# Patient Record
Sex: Female | Born: 1990 | Race: Black or African American | Hispanic: No | Marital: Married | Smoking: Never smoker
Health system: Southern US, Community
[De-identification: ages and names within clinical notes are randomized; demographics above are authoritative.]

## PROBLEM LIST (undated history)

## (undated) ENCOUNTER — Inpatient Hospital Stay (HOSPITAL_COMMUNITY): Payer: Self-pay

## (undated) DIAGNOSIS — N946 Dysmenorrhea, unspecified: Secondary | ICD-10-CM

## (undated) DIAGNOSIS — Z789 Other specified health status: Secondary | ICD-10-CM

## (undated) DIAGNOSIS — O139 Gestational [pregnancy-induced] hypertension without significant proteinuria, unspecified trimester: Secondary | ICD-10-CM

## (undated) HISTORY — DX: Dysmenorrhea, unspecified: N94.6

---

## 2009-06-22 ENCOUNTER — Emergency Department (HOSPITAL_COMMUNITY): Admission: EM | Admit: 2009-06-22 | Discharge: 2009-06-22 | Payer: Self-pay | Admitting: Emergency Medicine

## 2009-07-01 HISTORY — PX: UMBILICAL HERNIA REPAIR: SHX2598

## 2009-07-01 HISTORY — PX: HERNIA REPAIR: SHX51

## 2009-07-05 ENCOUNTER — Encounter: Admission: RE | Admit: 2009-07-05 | Discharge: 2009-07-05 | Payer: Self-pay | Admitting: Emergency Medicine

## 2009-07-05 ENCOUNTER — Encounter: Admission: RE | Admit: 2009-07-05 | Discharge: 2009-07-05 | Payer: Self-pay | Admitting: Infectious Diseases

## 2009-07-07 ENCOUNTER — Encounter: Admission: RE | Admit: 2009-07-07 | Discharge: 2009-07-07 | Payer: Self-pay | Admitting: Emergency Medicine

## 2009-09-04 ENCOUNTER — Emergency Department (HOSPITAL_COMMUNITY): Admission: EM | Admit: 2009-09-04 | Discharge: 2009-09-04 | Payer: Self-pay | Admitting: Emergency Medicine

## 2009-09-05 ENCOUNTER — Emergency Department (HOSPITAL_COMMUNITY): Admission: EM | Admit: 2009-09-05 | Discharge: 2009-09-05 | Payer: Self-pay | Admitting: Family Medicine

## 2009-09-15 ENCOUNTER — Emergency Department (HOSPITAL_COMMUNITY): Admission: EM | Admit: 2009-09-15 | Discharge: 2009-09-15 | Payer: Self-pay | Admitting: Family Medicine

## 2009-09-19 ENCOUNTER — Encounter: Admission: RE | Admit: 2009-09-19 | Discharge: 2009-09-19 | Payer: Self-pay | Admitting: Family Medicine

## 2009-10-05 ENCOUNTER — Ambulatory Visit (HOSPITAL_BASED_OUTPATIENT_CLINIC_OR_DEPARTMENT_OTHER): Admission: RE | Admit: 2009-10-05 | Discharge: 2009-10-05 | Payer: Self-pay | Admitting: Surgery

## 2009-11-13 ENCOUNTER — Emergency Department (HOSPITAL_COMMUNITY): Admission: EM | Admit: 2009-11-13 | Discharge: 2009-11-13 | Payer: Self-pay | Admitting: Family Medicine

## 2010-07-22 ENCOUNTER — Encounter: Payer: Self-pay | Admitting: Emergency Medicine

## 2010-07-22 ENCOUNTER — Encounter: Payer: Self-pay | Admitting: Family Medicine

## 2010-09-17 LAB — DIFFERENTIAL
Eosinophils Absolute: 0.1 10*3/uL (ref 0.0–0.7)
Eosinophils Relative: 1 % (ref 0–5)
Lymphocytes Relative: 33 % (ref 12–46)
Lymphs Abs: 1.7 10*3/uL (ref 0.7–4.0)
Monocytes Absolute: 0.3 10*3/uL (ref 0.1–1.0)
Monocytes Relative: 5 % (ref 3–12)

## 2010-09-17 LAB — CBC
HCT: 32.3 % — ABNORMAL LOW (ref 36.0–46.0)
Hemoglobin: 10.9 g/dL — ABNORMAL LOW (ref 12.0–15.0)
MCV: 87.4 fL (ref 78.0–100.0)
RBC: 3.69 MIL/uL — ABNORMAL LOW (ref 3.87–5.11)
WBC: 5.2 10*3/uL (ref 4.0–10.5)

## 2010-09-17 LAB — POCT INFECTIOUS MONO SCREEN: Mono Screen: NEGATIVE

## 2010-09-19 LAB — PREGNANCY, URINE: Preg Test, Ur: NEGATIVE

## 2010-10-01 LAB — POCT URINALYSIS DIP (DEVICE)
Bilirubin Urine: NEGATIVE
Glucose, UA: NEGATIVE mg/dL
Ketones, ur: NEGATIVE mg/dL
Nitrite: NEGATIVE
Specific Gravity, Urine: 1.02 (ref 1.005–1.030)
pH: 5.5 (ref 5.0–8.0)

## 2011-03-03 IMAGING — CR DG CHEST 1V
2 series · 2 of 2 positions shown · non-contrast
Comparison: None

CLINICAL DATA: Positive tuberculin skin test

CHEST - 1 VIEW

[w chest pa]
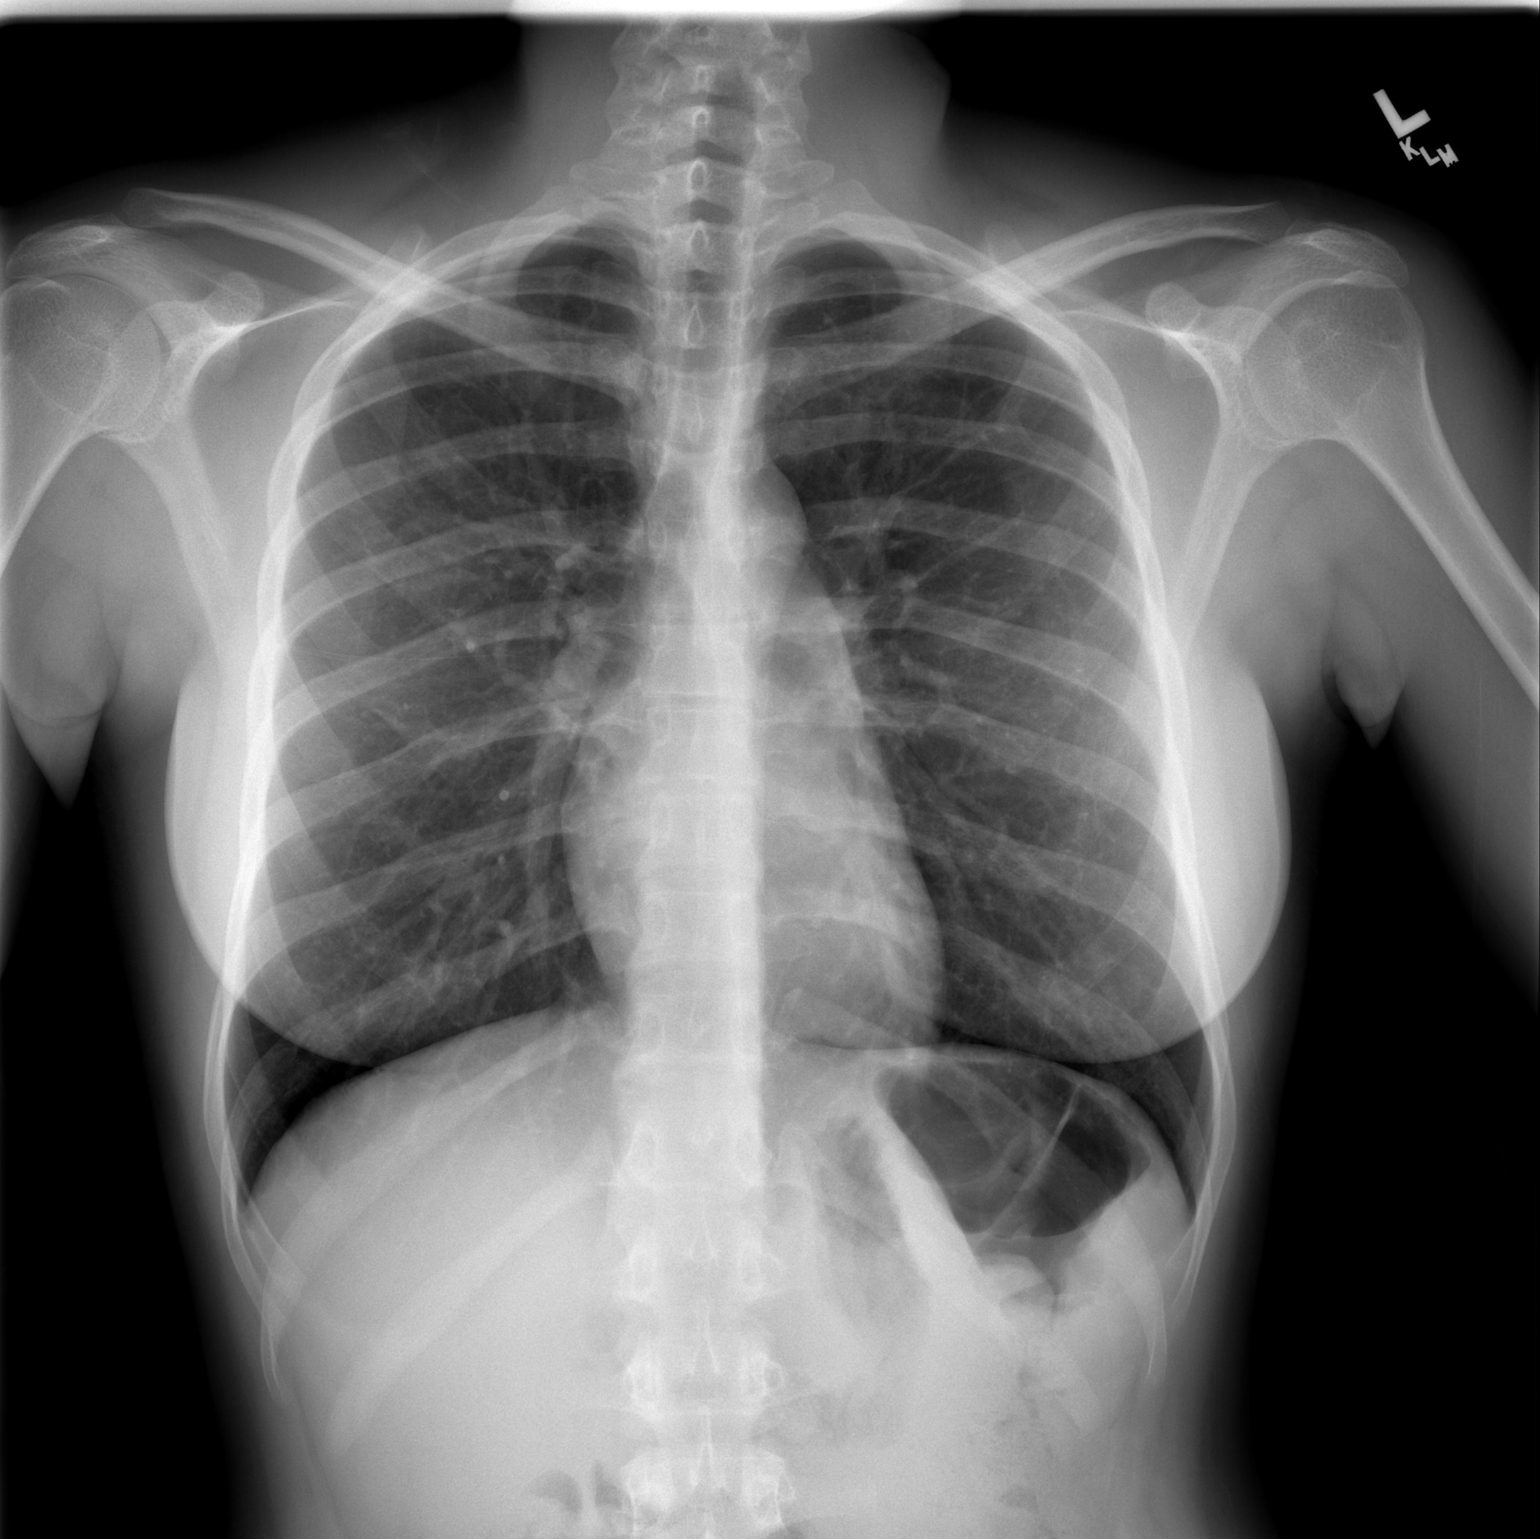

[w chest lat]
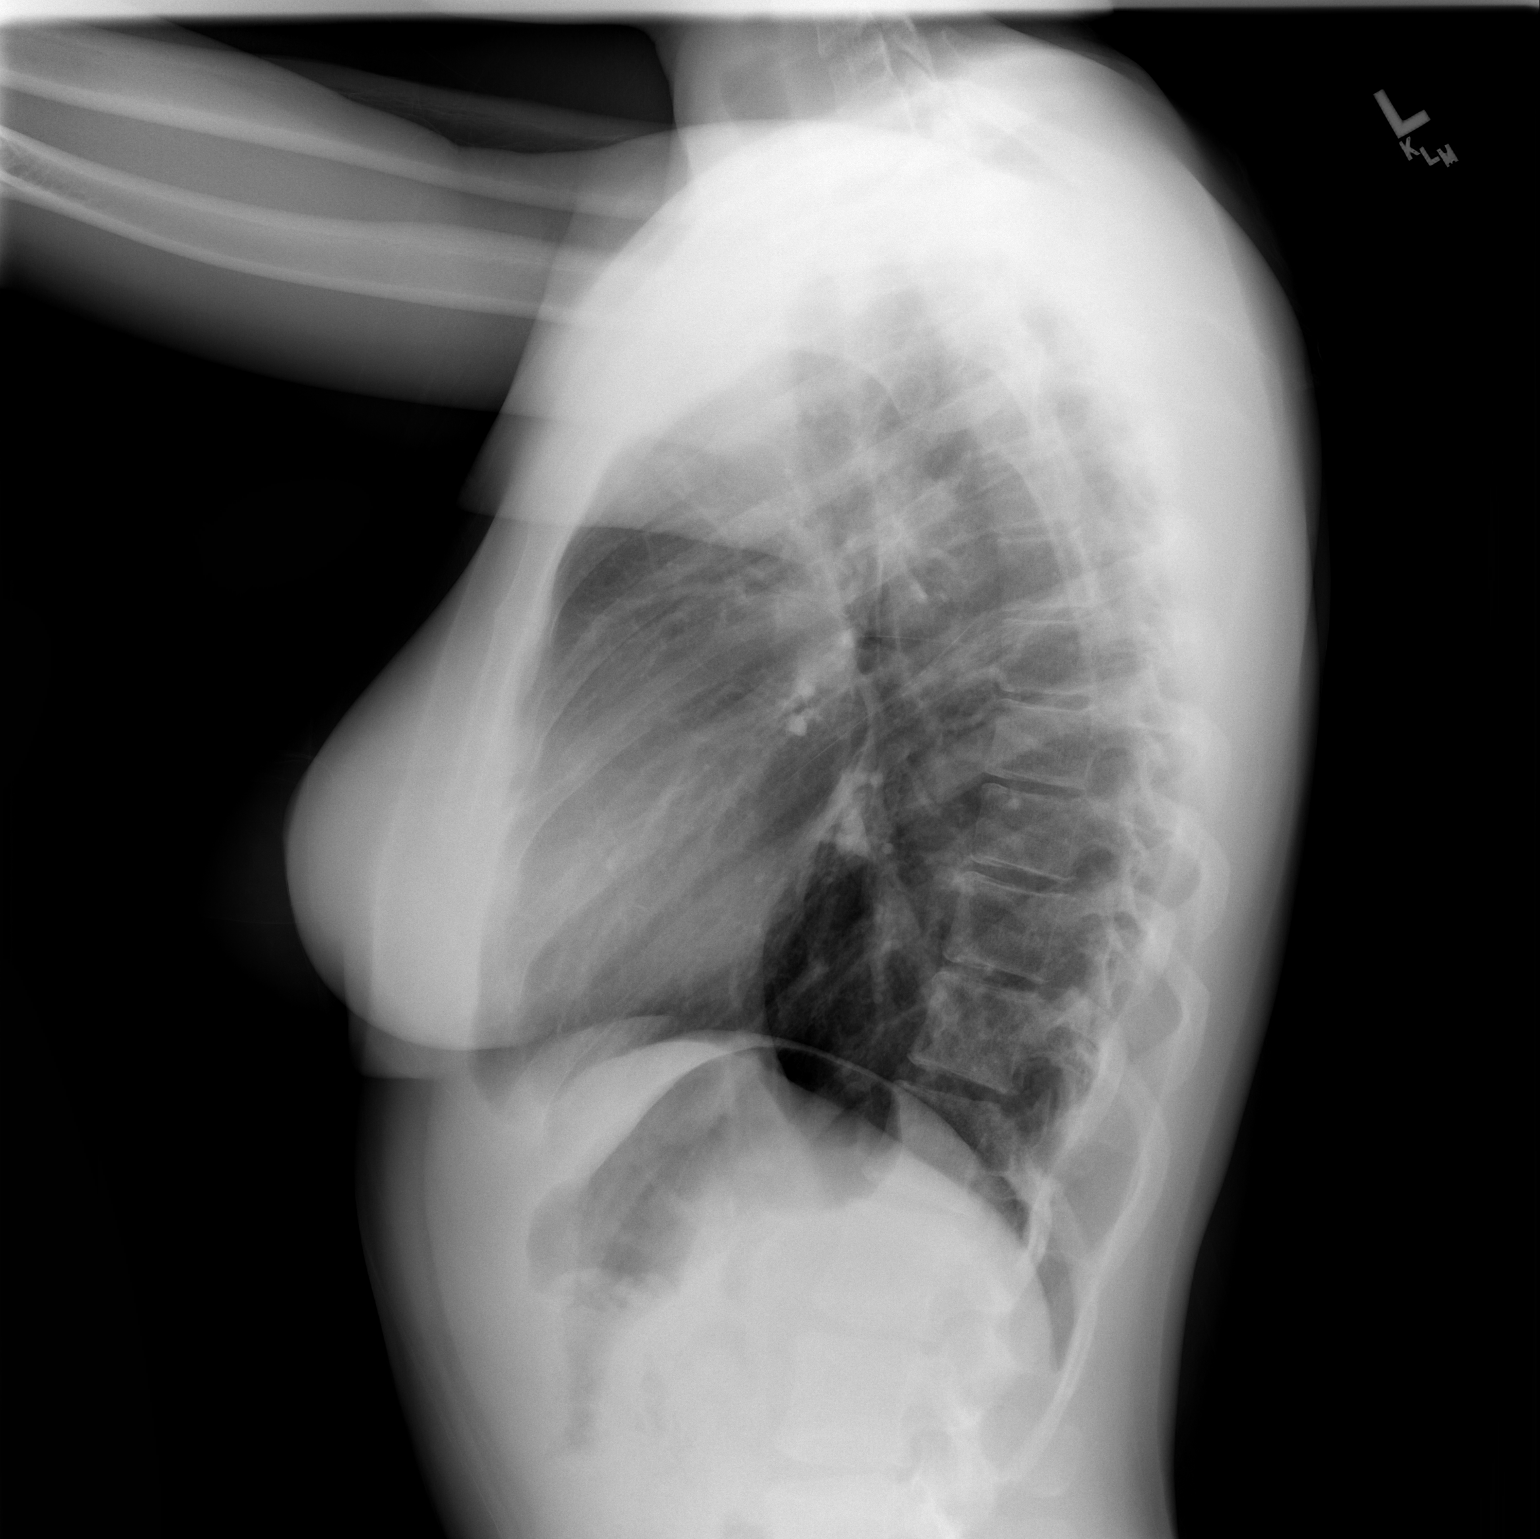

[2 of 2 positions shown; findings below may reference images not displayed]

FINDINGS: The lungs are clear.  Mediastinal contours are normal.
The heart is within normal limits in size.  No bony abnormality is
seen.
IMPRESSION: No active lung disease.

## 2011-11-17 ENCOUNTER — Emergency Department (HOSPITAL_COMMUNITY)
Admission: EM | Admit: 2011-11-17 | Discharge: 2011-11-17 | Disposition: A | Discharge status: Nursing Home (Includes ICF) | Payer: Medicaid Other | Source: Home / Self Care | Attending: Emergency Medicine | Admitting: Emergency Medicine

## 2011-11-17 ENCOUNTER — Encounter (HOSPITAL_COMMUNITY): Payer: Self-pay

## 2011-11-17 ENCOUNTER — Emergency Department (HOSPITAL_COMMUNITY): Payer: Medicaid Other

## 2011-11-17 ENCOUNTER — Emergency Department (HOSPITAL_COMMUNITY)
Admission: EM | Admit: 2011-11-17 | Discharge: 2011-11-17 | Disposition: A | Discharge status: Home / Self Care | Payer: Medicaid Other | Attending: Emergency Medicine | Admitting: Emergency Medicine

## 2011-11-17 ENCOUNTER — Encounter (HOSPITAL_COMMUNITY): Payer: Self-pay | Admitting: Emergency Medicine

## 2011-11-17 DIAGNOSIS — N949 Unspecified condition associated with female genital organs and menstrual cycle: Secondary | ICD-10-CM | POA: Insufficient documentation

## 2011-11-17 DIAGNOSIS — R109 Unspecified abdominal pain: Secondary | ICD-10-CM | POA: Insufficient documentation

## 2011-11-17 DIAGNOSIS — R112 Nausea with vomiting, unspecified: Secondary | ICD-10-CM | POA: Insufficient documentation

## 2011-11-17 DIAGNOSIS — N946 Dysmenorrhea, unspecified: Secondary | ICD-10-CM | POA: Insufficient documentation

## 2011-11-17 DIAGNOSIS — R102 Pelvic and perineal pain: Secondary | ICD-10-CM

## 2011-11-17 LAB — POCT URINALYSIS DIP (DEVICE)
Bilirubin Urine: NEGATIVE
Glucose, UA: NEGATIVE mg/dL
Ketones, ur: 80 mg/dL — AB
Nitrite: NEGATIVE

## 2011-11-17 LAB — DIFFERENTIAL
Eosinophils Absolute: 0 10*3/uL (ref 0.0–0.7)
Eosinophils Relative: 0 % (ref 0–5)
Lymphs Abs: 0.5 10*3/uL — ABNORMAL LOW (ref 0.7–4.0)
Monocytes Absolute: 0.2 10*3/uL (ref 0.1–1.0)
Monocytes Relative: 3 % (ref 3–12)

## 2011-11-17 LAB — COMPREHENSIVE METABOLIC PANEL
Alkaline Phosphatase: 71 U/L (ref 39–117)
BUN: 10 mg/dL (ref 6–23)
Calcium: 9.5 mg/dL (ref 8.4–10.5)
Creatinine, Ser: 0.69 mg/dL (ref 0.50–1.10)
GFR calc Af Amer: >90 mL/min (ref >90)
Glucose, Bld: 103 mg/dL — ABNORMAL HIGH (ref 70–99)
Potassium: 3.7 mEq/L (ref 3.5–5.1)
Total Protein: 7.6 g/dL (ref 6.0–8.3)

## 2011-11-17 LAB — CBC
HCT: 34.8 % — ABNORMAL LOW (ref 36.0–46.0)
Hemoglobin: 11.7 g/dL — ABNORMAL LOW (ref 12.0–15.0)
MCH: 27.7 pg (ref 26.0–34.0)
MCHC: 33.6 g/dL (ref 30.0–36.0)
MCV: 82.5 fL (ref 78.0–100.0)
RBC: 4.22 MIL/uL (ref 3.87–5.11)

## 2011-11-17 LAB — LIPASE, BLOOD: Lipase: 34 U/L (ref 11–59)

## 2011-11-17 LAB — WET PREP, GENITAL
Clue Cells Wet Prep HPF POC: NONE SEEN
Yeast Wet Prep HPF POC: NONE SEEN

## 2011-11-17 MED ORDER — KETOROLAC TROMETHAMINE 30 MG/ML IJ SOLN
30.0000 mg | Freq: Once | INTRAMUSCULAR | Status: AC
  Administered 2011-11-17: 30 mg via INTRAVENOUS
  Filled 2011-11-17: qty 1

## 2011-11-17 MED ORDER — MORPHINE SULFATE 2 MG/ML IJ SOLN
INTRAMUSCULAR | Status: AC
  Filled 2011-11-17: qty 2

## 2011-11-17 MED ORDER — SODIUM CHLORIDE 0.9 % IV BOLUS (SEPSIS)
1000.0000 mL | Freq: Once | INTRAVENOUS | Status: AC
  Administered 2011-11-17: 1000 mL via INTRAVENOUS

## 2011-11-17 MED ORDER — MORPHINE SULFATE 2 MG/ML IJ SOLN
4.0000 mg | Freq: Once | INTRAMUSCULAR | Status: AC
  Administered 2011-11-17: 4 mg via INTRAMUSCULAR

## 2011-11-17 MED ORDER — ONDANSETRON 4 MG PO TBDP
4.0000 mg | ORAL_TABLET | Freq: Once | ORAL | Status: AC
Start: 2011-11-17 — End: 2011-11-17
  Administered 2011-11-17: 4 mg via ORAL

## 2011-11-17 MED ORDER — ONDANSETRON 4 MG PO TBDP
ORAL_TABLET | ORAL | Status: AC
  Filled 2011-11-17: qty 1

## 2011-11-17 MED ORDER — NAPROXEN 500 MG PO TABS: 500.0000 mg | ORAL_TABLET | Freq: Two times a day (BID) | ORAL | Status: DC

## 2011-11-17 MED ORDER — KETOROLAC TROMETHAMINE 30 MG/ML IJ SOLN
60.0000 mg | Freq: Once | INTRAMUSCULAR | Status: AC
  Administered 2011-11-17: 60 mg via INTRAMUSCULAR

## 2011-11-17 MED ORDER — ONDANSETRON HCL 4 MG/2ML IJ SOLN
4.0000 mg | Freq: Once | INTRAMUSCULAR | Status: AC
  Administered 2011-11-17: 4 mg via INTRAVENOUS
  Filled 2011-11-17: qty 2

## 2011-11-17 MED ORDER — KETOROLAC TROMETHAMINE 60 MG/2ML IM SOLN
INTRAMUSCULAR | Status: AC
  Filled 2011-11-17: qty 2

## 2011-11-17 NOTE — ED Notes (Signed)
MD and tech at bedside with pelvic cart.

## 2011-11-17 NOTE — ED Notes (Signed)
Family at bedside with patient's clothes.

## 2011-11-17 NOTE — ED Notes (Signed)
Pelvic cart placed at bedside.  

## 2011-11-17 NOTE — ED Notes (Signed)
Attempted IV access x2; another RN to attempt.

## 2011-11-17 NOTE — ED Provider Notes (Signed)
History     CSN: [id number]  Arrival date & time 11/17/11  Diane [name]   First MD Initiated Contact with Patient 11/17/11 2030      Chief Complaint  Patient presents with  . Abdominal Pain    (Consider location/radiation/quality/duration/timing/severity/associated sxs/prior treatment) HPI Comments: Patient presents with lower abdominal pain that started today. It is sharp and crampy and similar to pain and she's had with her menses but much worse today. It worsens with movement and better with lying still. She is not taking any medication for pain. Associated with nausea and vomiting she often has with her menses. Denies any dysuria, hematuria, fever, chills. She is currently on her period. No vaginal discharge. No sexual activity for the past 6 months. Has had umbilical hernia repair.  The history is provided by the patient.    History reviewed. No pertinent past medical history.  Past Surgical History  Procedure Date  . Hernia repair     No family history on file.  History  Substance Use Topics  . Smoking status: Never Smoker   . Smokeless tobacco: Not on file  . Alcohol Use: No    OB History    Grav Para Term Preterm Abortions TAB SAB Ect Mult Living                  Review of Systems  Constitutional: Negative for fever, activity change and appetite change.  HENT: Negative for rhinorrhea.   Respiratory: Negative for chest tightness.   Cardiovascular: Negative for chest pain.  Gastrointestinal: Positive for nausea, vomiting and abdominal pain.  Genitourinary: Positive for vaginal bleeding. Negative for dysuria, hematuria and vaginal discharge.  Musculoskeletal: Negative for back pain.  Skin: Negative for rash.  Neurological: Negative for headaches.    Allergies  Coconut flavor; Fish allergy; and Pineapple  Home Medications   Current Outpatient Rx  Name Route Sig Dispense Refill  . NAPROXEN 500 MG PO TABS Oral Take 1 tablet (500 mg total) by mouth 2 (two) times  daily. 30 tablet 0    BP 113/74  Pulse 76  Temp(Src) 98.3 F (36.8 C) (Oral)  Resp 14  SpO2 98%  LMP 11/17/2011  Physical Exam  Constitutional: She is oriented to person, place, and time. She appears well-developed and well-nourished. No distress.  HENT:  Head: Normocephalic and atraumatic.  Mouth/Throat: Oropharynx is clear and moist. No oropharyngeal exudate.  Eyes: Conjunctivae and EOM are normal. Pupils are equal, round, and reactive to light.  Neck: Normal range of motion. Neck supple.  Cardiovascular: Normal rate, regular rhythm and normal heart sounds.   Pulmonary/Chest: Effort normal and breath sounds normal. No respiratory distress.  Abdominal: Soft. There is tenderness. There is no rebound and no guarding.       Mild lower abdominal tenderness suprapubically, left lower quadrant, right lower quadrant. No guarding or rebound, no pain at McBurney's point  Genitourinary: Cervix exhibits no motion tenderness. Right adnexum displays tenderness. Right adnexum displays no mass. Left adnexum displays tenderness. Left adnexum displays no mass. There is bleeding around the vagina.  Musculoskeletal: Normal range of motion. She exhibits no edema and no tenderness.  Neurological: She is alert and oriented to person, place, and time. No cranial nerve deficit.  Skin: Skin is warm.    ED Course  Procedures (including critical care time)  Labs Reviewed  CBC - Abnormal; Notable for the following:    Hemoglobin 11.7 (*)    HCT 34.8 (*)    All other components  within normal limits  DIFFERENTIAL - Abnormal; Notable for the following:    Neutrophils Relative 88 (*)    Lymphocytes Relative 9 (*)    Lymphs Abs 0.5 (*)    All other components within normal limits  COMPREHENSIVE METABOLIC PANEL - Abnormal; Notable for the following:    Glucose, Bld 103 (*)    All other components within normal limits  WET PREP, GENITAL - Abnormal; Notable for the following:    WBC, Wet Prep HPF POC FEW  (*)    All other components within normal limits  LIPASE, BLOOD  GC/CHLAMYDIA PROBE AMP, GENITAL   US Transvaginal Non-ob  11/17/2011  *RADIOLOGY REPORT*  Clinical Data: Pelvic pain  TRANSABDOMINAL AND TRANSVAGINAL ULTRASOUND OF PELVIS Technique:  Both transabdominal and transvaginal ultrasound examinations of the pelvis were performed. Transabdominal technique was performed for global imaging of the pelvis including uterus, ovaries, adnexal regions, and pelvic cul-de-sac.  Comparison: None.   It was necessary to proceed with endovaginal exam following the transabdominal exam to visualize the endometrium and adnexa.  Findings:  Uterus: Normal sonographic appearance, measuring 8.0 x 3.5 x 5.2 cm.  There is a small amount of fluid within the lower uterine segment.  Endometrium: Normal sonographic appearance, measuring 3 mm.  Right ovary:  Measures 2.3 x 1.3 x 1.2 cm.  Normal sonographic appearance.  Left ovary: Measures 4.0 x 1.8 x 1.7 cm.  Normal sonographic appearance.  Other findings: Trace free fluid.  IMPRESSION: Normal study. No evidence of pelvic mass or other significant abnormality.  Original Report Authenticated By: Waneta Martins, M.D.   US Pelvis Complete  11/17/2011  *RADIOLOGY REPORT*  Clinical Data: Pelvic pain  TRANSABDOMINAL AND TRANSVAGINAL ULTRASOUND OF PELVIS Technique:  Both transabdominal and transvaginal ultrasound examinations of the pelvis were performed. Transabdominal technique was performed for global imaging of the pelvis including uterus, ovaries, adnexal regions, and pelvic cul-de-sac.  Comparison: None.   It was necessary to proceed with endovaginal exam following the transabdominal exam to visualize the endometrium and adnexa.  Findings:  Uterus: Normal sonographic appearance, measuring 8.0 x 3.5 x 5.2 cm.  There is a small amount of fluid within the lower uterine segment.  Endometrium: Normal sonographic appearance, measuring 3 mm.  Right ovary:  Measures 2.3 x 1.3 x 1.2  cm.  Normal sonographic appearance.  Left ovary: Measures 4.0 x 1.8 x 1.7 cm.  Normal sonographic appearance.  Other findings: Trace free fluid.  IMPRESSION: Normal study. No evidence of pelvic mass or other significant abnormality.  Original Report Authenticated By: Waneta Martins, M.D.     1. Pelvic pain   2. Dysmenorrhea       MDM  Pelvic pain similar to dysmenorrhea to say with nausea and vomiting. Urinalysis and pregnancy test negative at urgent care.  Labs unremarkable. Patient has bilateral adnexal tenderness on pelvic exam. Ultrasound does not show any pathology. Normal ovaries with normal flow. No cysts.  Patient stable for followup with gynecology. Will discharge on Naprosyn.      Glynn Octave, MD 11/17/11 [phone]

## 2011-11-17 NOTE — ED Notes (Signed)
No need to move to CDU; patient being discharged.

## 2011-11-17 NOTE — ED Notes (Addendum)
Pt woke this am with abd pain and vomiting, denies diarrhea and has some burning with urination.  Pt started her period today.

## 2011-11-17 NOTE — ED Notes (Signed)
Patient given discharge paperwork; went over discharge instructions with patient.  Patient instructed to take naprosyn as directed, to call [facility] for a follow up appointment within two weeks, and to return to the ED for new, worsening, or concerning symptoms.

## 2011-11-17 NOTE — ED Notes (Signed)
Patient complaining of abdominal pain that woke her up this morning around 0800; describes location of abdominal pain as left and right lower quadrants; rates pain 10/10 on the numerical pain scale.  Patient reports nausea and vomiting; denies diarrhea.  Reports 10+ episodes of emesis within the last 24 hours.  Last menstrual period started today.  Patient denies any abnormal vaginal discharge; denies any changes in urine including urinary frequency and dysuria.  Patient alert and oriented x4; PERRL present.  Upon arrival to room, patient changed into gown and connected to continuous cardiac, pulse ox, and blood pressure monitor.  Will continue to monitor.

## 2011-11-17 NOTE — ED Notes (Signed)
Patient back from ultrasound; no respiratory or acute distress noted.  Patient updated on plan of care; informed patient that we are currently waiting on EDP to review ultrasound results.  Patient has no other questions or concerns at this time; will continue to monitor.

## 2011-11-17 NOTE — Discharge Instructions (Signed)
Dysmenorrhea Menstrual pain is caused by the muscles of the uterus tightening (contracting) during a menstrual period. The muscles of the uterus contract due to the chemicals in the uterine lining. Primary dysmenorrhea is menstrual cramps that last a couple of days when you start having menstrual periods or soon after. This often begins after a teenager starts having her period. As a woman gets older or has a baby, the cramps will usually lesson or disappear. Secondary dysmenorrhea begins later in life, lasts longer, and the pain may be stronger than primary dysmenorrhea. The pain may start before the period and last a few days after the period. This type of dysmenorrhea is usually caused by an underlying problem such as:  The tissue lining the uterus grows outside of the uterus in other areas of the body (endometriosis).   The endometrial tissue, which normally lines the uterus, is found in or grows into the muscular walls of the uterus (adenomyosis).   The pelvic blood vessels are engorged with blood just before the menstrual period (pelvic congestive syndrome).   Overgrowth of cells in the lining of the uterus or cervix (polyps of the uterus or cervix).   Falling down of the uterus (prolapse) because of loose or stretched ligaments.   Depression.   Bladder problems, infection, or inflammation.   Problems with the intestine, a tumor, or irritable bowel syndrome.   Cancer of the female organs or bladder.   A severely tipped uterus.   A very tight opening or closed cervix.   Noncancerous tumors of the uterus (fibroids).   Pelvic inflammatory disease (PID).   Pelvic scarring (adhesions) from a previous surgery.   Ovarian cyst.   An intrauterine device (IUD) used for birth control.  CAUSES  The cause of menstrual pain is often unknown. SYMPTOMS   Cramping or throbbing pain in your lower abdomen.   Sometimes, a woman may also experience headaches.   Lower back pain.    Feeling sick to your stomach (nausea) or vomiting.   Diarrhea.   Sweating or dizziness.  DIAGNOSIS  A diagnosis is based on your history, symptoms, physical examination, diagnostic tests, or procedures. Diagnostic tests or procedures may include:  Blood tests.   An ultrasound.   An examination of the lining of the uterus (dilation and curettage, D&C).   An examination inside your abdomen or pelvis with a scope (laparoscopy).   X-rays.   CT Scan.   MRI.   An examination inside the bladder with a scope (cystoscopy).   An examination inside the intestine or stomach with a scope (colonoscopy, gastroscopy).  TREATMENT  Treatment depends on the cause of the dysmenorrhea. Treatment may include:  Pain medicine prescribed by your caregiver.   Birth control pills.   Hormone replacement therapy.   Nonsteroidal anti-inflammatory drugs (NSAIDs). These may help stop the production of prostaglandins.   An IUD with progesterone hormone in it.   Acupuncture.   Surgery to remove adhesions, endometriosis, ovarian cyst, or fibroids.   Removal of the uterus (hysterectomy).   Progesterone shots to stop the menstrual period.   Cutting the nerves on the sacrum that go to the female organs (presacral neurectomy).   Electric currant to the sacral nerves (sacral nerve stimulation).   Antidepressant medicine.   Psychiatric therapy, counseling, or group therapy.   Exercise and physical therapy.   Meditation and yoga therapy.  HOME CARE INSTRUCTIONS   Only take over-the-counter or prescription medicines for pain, discomfort, or fever as directed by your   caregiver.   Place a heating pad or hot water bottle on your lower back or abdomen. Do not sleep with the heating pad.   Use aerobic exercises, walking, swimming, biking, and other exercises to help lessen the cramping.   Massage to the lower back or abdomen may help.   Stop smoking.   Avoid alcohol and caffeine.   Yoga,  meditation, or acupuncture may help.  SEEK MEDICAL CARE IF:   The pain does not get better with medicine.   You have pain with sexual intercourse.  SEEK IMMEDIATE MEDICAL CARE IF:   Your pain increases and is not controlled with medicines.   You have a fever.   You develop nausea or vomiting with your period not controlled with medicine.   You have abnormal vaginal bleeding with your period.   You pass out.  MAKE SURE YOU:   Understand these instructions.   Will watch your condition.   Will get help right away if you are not doing well or get worse.  Document Released: 06/17/2005 Document Revised: 06/06/2011 Document Reviewed: 10/03/2008 [employer] Patient Information 2012 [employer]. 

## 2011-11-17 NOTE — ED Notes (Signed)
C/o lower abd pain, nausea, and vomiting since this morning.  States LMP started today. Denies diarrhea.

## 2011-11-17 NOTE — ED Notes (Signed)
Patient currently resting quietly in bed; no respiratory or acute distress noted.  Patient being prepared for transport to Ultrasound; will continue to monitor.

## 2011-11-17 NOTE — ED Provider Notes (Signed)
History     CSN: [id number]  Arrival date & time 11/17/11  1642   First MD Initiated Contact with Patient 11/17/11 1701      Chief Complaint  Patient presents with  . Abdominal Pain    (Consider location/radiation/quality/duration/timing/severity/associated sxs/prior treatment) HPI Comments: Patient with the acute onset of constant, lower abdominal and pelvic pain starting today. States she has similar pain like this every month when she starts menses, but today the pain is much more severe and is also located in the left lower quadrant. Pain is worse with standing up and walking, and with movement. Better with lying still. She has not tried anything else for this. Reports nausea, vomiting, some dysuria. No urgency, frequency, cloudy or oerous urine. No fevers. No abdominal distention. No vaginal discharge or odor. States she's not been sexually active in 4 months. No constipation, diarrhea. Has history of umbilical hernia repair. No other abdominal surgeries. No history of STI's.  ROS as noted in HPI. All other ROS negative.  Patient is a 21 y.o. female presenting with abdominal pain. The history is provided by the patient. No language interpreter was used.  Abdominal Pain The primary symptoms of the illness include abdominal pain, nausea and vomiting. The current episode started 6 to 12 hours ago. The onset of the illness was sudden. The problem has been gradually worsening.  The patient states that she believes she is currently not pregnant. The patient has not had a change in bowel habit. Additional symptoms associated with the illness include anorexia. Symptoms associated with the illness do not include chills, constipation, urgency, hematuria, frequency or back pain.    History reviewed. No pertinent past medical history.  Past Surgical History  Procedure Date  . Hernia repair     History reviewed. No pertinent family history.  History  Substance Use Topics  . Smoking  status: Never Smoker   . Smokeless tobacco: Not on file  . Alcohol Use: No    OB History    Grav Para Term Preterm Abortions TAB SAB Ect Mult Living                  Review of Systems  Constitutional: Negative for chills.  Gastrointestinal: Positive for nausea, vomiting, abdominal pain and anorexia. Negative for constipation.  Genitourinary: Negative for urgency, frequency and hematuria.  Musculoskeletal: Negative for back pain.    Allergies  Coconut flavor and Pineapple  Home Medications  No current outpatient prescriptions on file.  BP 136/87  Pulse 97  Temp(Src) 98.4 F (36.9 C) (Oral)  Resp 20  SpO2 100%  LMP 11/17/2011  Physical Exam  Nursing note and vitals reviewed. Constitutional: She is oriented to person, place, and time. She appears well-developed and well-nourished. She appears distressed.       Appears uncomfortable, ill.  HENT:  Head: Normocephalic and atraumatic.  Eyes: Conjunctivae and EOM are normal.  Neck: Normal range of motion.  Cardiovascular: Normal rate, regular rhythm, normal heart sounds and intact distal pulses.   Pulmonary/Chest: Effort normal and breath sounds normal.  Abdominal: Soft. Normal appearance and bowel sounds are normal. She exhibits no distension. There is no hepatosplenomegaly. There is tenderness in the suprapubic area and left lower quadrant. There is no rigidity, no rebound, no CVA tenderness, no tenderness at McBurney's point and negative Murphy's sign.       Healed umbilical surgical scar. Voluntary gaurding  Musculoskeletal: Normal range of motion. She exhibits no edema and no tenderness.  Neurological: She  is alert and oriented to person, place, and time.  Skin: Skin is warm and dry.  Psychiatric: She has a normal mood and affect. Her behavior is normal. Judgment and thought content normal.    ED Course  Procedures (including critical care time)  Labs Reviewed  POCT URINALYSIS DIP (DEVICE) - Abnormal; Notable for  the following:    Ketones, ur 80 (*)    Hgb urine dipstick LARGE (*)    All other components within normal limits  POCT PREGNANCY, URINE   No results found.   1. Abdominal pain      Results for orders placed during the hospital encounter of 11/17/11  POCT URINALYSIS DIP (DEVICE)      Component Value Range   Glucose, UA NEGATIVE  NEGATIVE (mg/dL)   Bilirubin Urine NEGATIVE  NEGATIVE    Ketones, ur 80 (*) NEGATIVE (mg/dL)   Specific Gravity, Urine 1.020  1.005 - 1.030    Hgb urine dipstick LARGE (*) NEGATIVE    pH 7.0  5.0 - 8.0    Protein, ur NEGATIVE  NEGATIVE (mg/dL)   Urobilinogen, UA 0.2  0.0 - 1.0 (mg/dL)   Nitrite NEGATIVE  NEGATIVE    Leukocytes, UA NEGATIVE  NEGATIVE   POCT PREGNANCY, URINE      Component Value Range   Preg Test, Ur NEGATIVE  NEGATIVE     MDM  P Patient appears ill, has suprapubic and left lower quadrant tenderness with voluntary guarding. Negative McBurney's. Pain when table is  jostled. Concern for peritoneal irritation. The patient not pregnant, no evidence of infection on udip. Giving Toradol, Zofran, morphine here. Deferring pelvic as this will not change decision to transfer. Transferring to the ED for ultrasound or scan.   Luiz Blare, MD 11/17/11 [phone]

## 2011-11-17 NOTE — ED Notes (Signed)
Patient states that her family went home with her clothes; patient calling family now to bring clothes to ED.

## 2012-07-15 ENCOUNTER — Inpatient Hospital Stay (HOSPITAL_COMMUNITY)
Admission: AD | Admit: 2012-07-15 | Discharge: 2012-07-15 | Disposition: A | Discharge status: Home / Self Care | Payer: Self-pay | Source: Ambulatory Visit | Attending: Obstetrics & Gynecology | Admitting: Obstetrics & Gynecology

## 2012-07-15 ENCOUNTER — Encounter (HOSPITAL_COMMUNITY): Payer: Self-pay | Admitting: *Deleted

## 2012-07-15 DIAGNOSIS — R300 Dysuria: Secondary | ICD-10-CM | POA: Insufficient documentation

## 2012-07-15 DIAGNOSIS — Z3201 Encounter for pregnancy test, result positive: Secondary | ICD-10-CM | POA: Insufficient documentation

## 2012-07-15 DIAGNOSIS — Z3490 Encounter for supervision of normal pregnancy, unspecified, unspecified trimester: Secondary | ICD-10-CM

## 2012-07-15 LAB — URINALYSIS, ROUTINE W REFLEX MICROSCOPIC
Glucose, UA: NEGATIVE mg/dL
Ketones, ur: NEGATIVE mg/dL
pH: 5.5 (ref 5.0–8.0)

## 2012-07-15 LAB — WET PREP, GENITAL

## 2012-07-15 LAB — POCT PREGNANCY, URINE: Preg Test, Ur: POSITIVE — AB

## 2012-07-15 LAB — URINE MICROSCOPIC-ADD ON

## 2012-07-15 MED ORDER — ALBUTEROL SULFATE (5 MG/ML) 0.5% IN NEBU: 2.5000 mg | INHALATION_SOLUTION | Freq: Once | RESPIRATORY_TRACT | Status: DC

## 2012-07-15 MED ORDER — IPRATROPIUM BROMIDE 0.02 % IN SOLN: 0.5000 mg | Freq: Once | RESPIRATORY_TRACT | Status: DC

## 2012-07-15 MED ORDER — PRENATAL VITAMINS (DIS) PO TABS: 1.0000 | ORAL_TABLET | Freq: Every day | ORAL | Status: DC

## 2012-07-15 MED ORDER — NITROFURANTOIN MONOHYD MACRO 100 MG PO CAPS: 100.0000 mg | ORAL_CAPSULE | Freq: Two times a day (BID) | ORAL | Status: DC

## 2012-07-15 MED ORDER — PHENAZOPYRIDINE HCL 100 MG PO TABS: 100.0000 mg | ORAL_TABLET | Freq: Three times a day (TID) | ORAL | Status: DC | PRN

## 2012-07-15 NOTE — MAU Provider Note (Signed)
History   Diane [name] is a 22 y.o. G1P0  At [redacted]w[redacted]d by LMP, who presents today to confirm pregnancy and dysuria since Sunday.  She has noted pain with urination and suprapubic abdominal pain.  At the time of my exam, she is quite emotionally distressed regarding the news of her pregnancy, but otherwise doing well.  She has noted a pink-tinged discharge lately, but no frank bleeding. She does describe exquisite burning pain with urination, but not constant pain.      CSN: [id number]  Arrival date and time: 07/15/12 1119   First Provider Initiated Contact with Patient 07/15/12 1402      Chief Complaint  Patient presents with  . Possible Pregnancy  . Dysuria   HPI  OB History    Grav Para Term Preterm Abortions TAB SAB Ect Mult Living   1         0      History reviewed. No pertinent past medical history.  Past Surgical History  Procedure Date  . Hernia repair     History reviewed. No pertinent family history.  History  Substance Use Topics  . Smoking status: Never Smoker   . Smokeless tobacco: Never Used  . Alcohol Use: No    Allergies:  Allergies  Allergen Reactions  . Coconut Flavor Rash  . Fish Allergy Rash  . Pineapple Rash    No prescriptions prior to admission    Review of Systems  Constitutional: Negative for fever and chills.  HENT: Negative for sore throat.   Eyes: Negative for blurred vision.  Respiratory: Positive for cough. Negative for hemoptysis and sputum production.   Cardiovascular: Negative for chest pain and palpitations.  Gastrointestinal: Negative for heartburn, nausea, vomiting, diarrhea and constipation.  Genitourinary: Positive for dysuria. Negative for hematuria.  Neurological: Negative for dizziness and headaches.   Physical Exam   Blood pressure 125/73, pulse 88, temperature 97.9 F (36.6 C), temperature source Oral, resp. rate 18, height 5' 6.5" (1.689 m), weight 74.39 kg (164 lb), last menstrual period 06/18/2012.  Physical  Exam  Constitutional: She is oriented to person, place, and time. She appears well-developed and well-nourished. No distress.  HENT:  Head: Normocephalic and atraumatic.  Eyes: Conjunctivae normal are normal. Pupils are equal, round, and reactive to light.  Neck: Normal range of motion. Neck supple.  Cardiovascular: Normal rate, regular rhythm, normal heart sounds and intact distal pulses.   No murmur heard. Respiratory: Effort normal and breath sounds normal. No respiratory distress.  GI: Soft. There is tenderness.       Suprapubic tenderness  Genitourinary: Vaginal discharge found.  Musculoskeletal: Normal range of motion. She exhibits no edema and no tenderness.  Neurological: She is alert and oriented to person, place, and time.  Skin: Skin is warm and dry.  Psychiatric: Her behavior is normal.    MAU Course  Procedures Results for orders placed during the hospital encounter of 07/15/12 (from the past 24 hour(s))  URINALYSIS, ROUTINE W REFLEX MICROSCOPIC     Status: Abnormal   Collection Time   07/15/12 11:45 AM      Component Value Range   Color, Urine YELLOW  YELLOW   APPearance CLEAR  CLEAR   Specific Gravity, Urine <1.005 (*) 1.005 - 1.030   pH 5.5  5.0 - 8.0   Glucose, UA NEGATIVE  NEGATIVE mg/dL   Hgb urine dipstick MODERATE (*) NEGATIVE   Bilirubin Urine NEGATIVE  NEGATIVE   Ketones, ur NEGATIVE  NEGATIVE mg/dL  Protein, ur NEGATIVE  NEGATIVE mg/dL   Urobilinogen, UA 0.2  0.0 - 1.0 mg/dL   Nitrite NEGATIVE  NEGATIVE   Leukocytes, UA TRACE (*) NEGATIVE  URINE MICROSCOPIC-ADD ON     Status: Normal   Collection Time   07/15/12 11:45 AM      Component Value Range   Squamous Epithelial / LPF RARE  RARE   WBC, UA 0-2  <3 WBC/hpf   RBC / HPF 21-50  <3 RBC/hpf   Bacteria, UA RARE  RARE  POCT PREGNANCY, URINE     Status: Abnormal   Collection Time   07/15/12 12:01 PM      Component Value Range   Preg Test, Ur POSITIVE (*) NEGATIVE  WET PREP, GENITAL     Status:  Abnormal   Collection Time   07/15/12  3:02 PM      Component Value Range   Yeast Wet Prep HPF POC NONE SEEN  NONE SEEN   Trich, Wet Prep NONE SEEN  NONE SEEN   Clue Cells Wet Prep HPF POC NONE SEEN  NONE SEEN   WBC, Wet Prep HPF POC FEW (*) NONE SEEN      Assessment and Plan  Diane [name] is a 22 y.o. G1P0  At [redacted]w[redacted]d by LMP, who presented today to confirm pregnancy and for dysuria since Sunday.  1. Dysuria   2. Pregnant    Given her significant symptoms, though a UA was negative, will send home with antibiotic and pan relieving prescriptions. Also sending with pre-natal vitamin prescription and instruction to establish with prenatal care provider.  Bonnita Nasuti 07/15/2012, 2:32 PM

## 2012-07-15 NOTE — MAU Note (Signed)
Period is 3 days late, did home test was positive.  Have to go to the restroom frequently, hurts and burns when she goes and she can't hold it.

## 2012-07-16 LAB — GC/CHLAMYDIA PROBE AMP: GC Probe RNA: NEGATIVE

## 2012-07-16 NOTE — MAU Provider Note (Signed)
Attestation of Attending Supervision of Advanced Practitioner (PA/CNM/NP): Evaluation and management procedures were performed by the Advanced Practitioner under my supervision and collaboration.  I have reviewed the Advanced Practitioner's note and chart, and I agree with the management and plan.  Amayiah Gosnell, MD, FACOG Attending Obstetrician & Gynecologist Faculty Practice, [facility]  

## 2013-04-30 ENCOUNTER — Encounter (HOSPITAL_COMMUNITY): Payer: Self-pay | Admitting: Emergency Medicine

## 2013-04-30 ENCOUNTER — Emergency Department (HOSPITAL_COMMUNITY)
Admission: EM | Admit: 2013-04-30 | Discharge: 2013-04-30 | Disposition: A | Discharge status: Home / Self Care | Payer: BC Managed Care – PPO | Source: Home / Self Care

## 2013-04-30 DIAGNOSIS — J069 Acute upper respiratory infection, unspecified: Secondary | ICD-10-CM

## 2013-04-30 LAB — POCT PREGNANCY, URINE: Preg Test, Ur: NEGATIVE

## 2013-04-30 MED ORDER — FEXOFENADINE HCL 180 MG PO TABS: 180.0000 mg | ORAL_TABLET | Freq: Every day | ORAL | Status: DC

## 2013-04-30 MED ORDER — IPRATROPIUM BROMIDE 0.06 % NA SOLN: 2.0000 | Freq: Four times a day (QID) | NASAL | Status: DC

## 2013-04-30 NOTE — ED Provider Notes (Signed)
CSN: [id number]     Arrival date & time 04/30/13  0941 History   First MD Initiated Contact with Patient 04/30/13 1011     Chief Complaint  Patient presents with  . Facial Pain   (Consider location/radiation/quality/duration/timing/severity/associated sxs/prior Treatment) HPI Comments: 22 Y O F with runny nose, nasal congestion and pain along the sides of the nose and glabella.    History reviewed. No pertinent past medical history. Past Surgical History  Procedure Laterality Date  . Hernia repair     History reviewed. No pertinent family history. History  Substance Use Topics  . Smoking status: Never Smoker   . Smokeless tobacco: Never Used  . Alcohol Use: No   OB History   Grav Para Term Preterm Abortions TAB SAB Ect Mult Living   1         0     Review of Systems  Constitutional: Negative.  Negative for fever, chills, activity change, appetite change and fatigue.  HENT: Positive for congestion, postnasal drip, rhinorrhea, sinus pressure and sore throat. Negative for facial swelling.   Eyes: Negative.   Respiratory: Negative.   Cardiovascular: Negative.   Gastrointestinal: Negative.   Musculoskeletal: Negative for neck pain and neck stiffness.  Skin: Negative for pallor and rash.  Neurological: Negative.     Allergies  Coconut flavor; Fish allergy; and Pineapple  Home Medications   Current Outpatient Rx  Name  Route  Sig  Dispense  Refill  . fexofenadine (ALLEGRA) 180 MG tablet   Oral   Take 1 tablet (180 mg total) by mouth daily.   14 tablet   0   . ipratropium (ATROVENT) 0.06 % nasal spray   Nasal   Place 2 sprays into the nose 4 (four) times daily.   15 mL   12   . nitrofurantoin, macrocrystal-monohydrate, (MACROBID) 100 MG capsule   Oral   Take 1 capsule (100 mg total) by mouth 2 (two) times daily.   14 capsule   0   . phenazopyridine (PYRIDIUM) 100 MG tablet   Oral   Take 1 tablet (100 mg total) by mouth 3 (three) times daily as needed for  pain.   6 tablet   0   . Prenatal Vitamins (DIS) TABS   Oral   Take 1 tablet by mouth daily.   30 tablet   2    BP 118/79  Pulse 74  Temp(Src) 98.1 F (36.7 C) (Oral)  SpO2 100%  LMP 04/09/2013  Breastfeeding? Unknown Physical Exam  Nursing note and vitals reviewed. Constitutional: She is oriented to person, place, and time. She appears well-developed and well-nourished. No distress.  HENT:  Right Ear: External ear normal.  Left Ear: External ear normal.  Mouth/Throat: Oropharynx is clear and moist. No oropharyngeal exudate.  Clear PND Applying pressure over the paranasal and frontal sinuses "makes it feel better".  Eyes: Conjunctivae and EOM are normal.  Neck: Normal range of motion. Neck supple.  Cardiovascular: Normal rate, regular rhythm and normal heart sounds.   Pulmonary/Chest: Effort normal and breath sounds normal. No respiratory distress. She has no wheezes. She has no rales.  Musculoskeletal: Normal range of motion. She exhibits no edema.  Lymphadenopathy:    She has no cervical adenopathy.  Neurological: She is alert and oriented to person, place, and time.  Skin: Skin is warm and dry. No rash noted.  Psychiatric: She has a normal mood and affect.    ED Course  Procedures (including critical care time) Labs  Review Labs Reviewed  POCT PREGNANCY, URINE   Imaging Review No results found.    MDM   1. URI (upper respiratory infection)        Plenty of fluids atrovent nasal spray Allegra 180mg  q d. aSudafed PE 10 mg q d  Hayden Rasmussen, NP 04/30/13 1046

## 2013-04-30 NOTE — ED Notes (Signed)
C/o sinus pressure and pain. Runny nose and sneezing since Tuesday. Pt has been taking nite quill with no releif.   Denies fever and any other symptoms.

## 2013-05-04 NOTE — ED Provider Notes (Signed)
Medical screening examination/treatment/procedure(s) were performed by resident physician or non-physician practitioner and as supervising physician I was immediately available for consultation/collaboration.   Jerardo Costabile DOUGLAS MD.   Khristian Phillippi D Jeneane Pieczynski, MD 05/04/13 1039 

## 2013-07-17 ENCOUNTER — Inpatient Hospital Stay (HOSPITAL_COMMUNITY)
Admission: AD | Admit: 2013-07-17 | Discharge: 2013-07-17 | Disposition: A | Discharge status: Home / Self Care | Payer: BC Managed Care – PPO | Source: Ambulatory Visit | Attending: Obstetrics & Gynecology | Admitting: Obstetrics & Gynecology

## 2013-07-17 ENCOUNTER — Emergency Department (HOSPITAL_COMMUNITY)
Admission: EM | Admit: 2013-07-17 | Discharge: 2013-07-17 | Disposition: A | Discharge status: Home / Self Care | Payer: BC Managed Care – PPO | Source: Home / Self Care | Attending: Emergency Medicine | Admitting: Emergency Medicine

## 2013-07-17 ENCOUNTER — Encounter (HOSPITAL_COMMUNITY): Payer: Self-pay | Admitting: Emergency Medicine

## 2013-07-17 ENCOUNTER — Other Ambulatory Visit (HOSPITAL_COMMUNITY)
Admission: RE | Admit: 2013-07-17 | Discharge: 2013-07-17 | Disposition: A | Discharge status: Home / Self Care | Payer: BC Managed Care – PPO | Source: Ambulatory Visit | Attending: Emergency Medicine | Admitting: Emergency Medicine

## 2013-07-17 DIAGNOSIS — Z113 Encounter for screening for infections with a predominantly sexual mode of transmission: Secondary | ICD-10-CM | POA: Insufficient documentation

## 2013-07-17 DIAGNOSIS — N760 Acute vaginitis: Secondary | ICD-10-CM | POA: Insufficient documentation

## 2013-07-17 LAB — POCT URINALYSIS DIP (DEVICE)
Bilirubin Urine: NEGATIVE
GLUCOSE, UA: NEGATIVE mg/dL
Ketones, ur: NEGATIVE mg/dL
Leukocytes, UA: NEGATIVE
NITRITE: NEGATIVE
Protein, ur: NEGATIVE mg/dL
Specific Gravity, Urine: 1.015 (ref 1.005–1.030)
Urobilinogen, UA: 0.2 mg/dL (ref 0.0–1.0)
pH: 7 (ref 5.0–8.0)

## 2013-07-17 LAB — POCT PREGNANCY, URINE: PREG TEST UR: NEGATIVE

## 2013-07-17 MED ORDER — METRONIDAZOLE 500 MG PO TABS: 500.0000 mg | ORAL_TABLET | Freq: Two times a day (BID) | ORAL | Status: DC

## 2013-07-17 NOTE — Discharge Instructions (Signed)

## 2013-07-17 NOTE — ED Notes (Signed)
C/O very foul odor to urine with frequent urination x > 1 month, poor appetite.  Denies any pain.  C/O significant amt white discharge each time she has intercourse (states not semen - occurs before ejaculation).  Has been taking cranberry pills.

## 2013-07-17 NOTE — ED Provider Notes (Signed)
Medical screening examination/treatment/procedure(s) were performed by non-physician practitioner and as supervising physician I was immediately available for consultation/collaboration.  Meryn Sarracino, M.D.  Shonteria Abeln C Dearra Myhand, MD 07/17/13 2206 

## 2013-07-17 NOTE — ED Provider Notes (Signed)
CSN: [id number]     Arrival date & time 07/17/13  1157 History   First MD Initiated Contact with Patient 07/17/13 1323     Chief Complaint  Patient presents with  . Urinary Tract Infection  . Vaginal Discharge   (Consider location/radiation/quality/duration/timing/severity/associated sxs/prior Treatment) HPI Comments: Reports 2-3 months of malodorous vaginal discharge along with intermittent noticeable odor with urination.  Patient is a 23 y.o. female presenting with vaginal discharge. The history is provided by the patient.  Vaginal Discharge Quality:  Thick Severity:  Moderate Onset quality:  Gradual Chronicity:  New Context: after intercourse, after urination and during urination   Relieved by:  None tried Ineffective treatments:  None tried Associated symptoms: no abdominal pain, no dyspareunia, no dysuria, no fever, no genital lesions, no nausea, no rash, no urinary frequency, no urinary hesitancy, no urinary incontinence, no vaginal itching and no vomiting     History reviewed. No pertinent past medical history. Past Surgical History  Procedure Laterality Date  . Hernia repair     No family history on file. History  Substance Use Topics  . Smoking status: Never Smoker   . Smokeless tobacco: Never Used  . Alcohol Use: No   OB History   Grav Para Term Preterm Abortions TAB SAB Ect Mult Living   1         0     Review of Systems  Constitutional: Negative for fever.  HENT: Negative.   Eyes: Negative.   Respiratory: Negative.   Cardiovascular: Negative.   Gastrointestinal: Negative for nausea, vomiting, abdominal pain and constipation.  Endocrine: Negative for polydipsia, polyphagia and polyuria.  Genitourinary: Positive for vaginal discharge. Negative for bladder incontinence, dysuria, hesitancy and dyspareunia.  Musculoskeletal: Negative for back pain.  Skin: Negative for rash.  Allergic/Immunologic: Negative for immunocompromised state.    Allergies  Coconut  flavor; Fish allergy; and Pineapple  Home Medications   Current Outpatient Rx  Name  Route  Sig  Dispense  Refill  . fexofenadine (ALLEGRA) 180 MG tablet   Oral   Take 1 tablet (180 mg total) by mouth daily.   14 tablet   0   . ipratropium (ATROVENT) 0.06 % nasal spray   Nasal   Place 2 sprays into the nose 4 (four) times daily.   15 mL   12   . nitrofurantoin, macrocrystal-monohydrate, (MACROBID) 100 MG capsule   Oral   Take 1 capsule (100 mg total) by mouth 2 (two) times daily.   14 capsule   0   . phenazopyridine (PYRIDIUM) 100 MG tablet   Oral   Take 1 tablet (100 mg total) by mouth 3 (three) times daily as needed for pain.   6 tablet   0   . Prenatal Vitamins (DIS) TABS   Oral   Take 1 tablet by mouth daily.   30 tablet   2    BP 113/64  Pulse 74  Temp(Src) 98.6 F (37 C) (Oral)  Resp 18  SpO2 100%  LMP 07/06/2013 Physical Exam  Nursing note and vitals reviewed. Constitutional: She is oriented to person, place, and time. She appears well-developed and well-nourished. No distress.  HENT:  Head: Normocephalic and atraumatic.  Nose: Nose normal.  Eyes: Conjunctivae are normal. No scleral icterus.  Neck: Normal range of motion. Neck supple.  Cardiovascular: Normal rate, regular rhythm and normal heart sounds.   Pulmonary/Chest: Effort normal.  Abdominal: Soft. She exhibits no distension. There is no tenderness.  Genitourinary: Uterus normal. Pelvic  exam was performed with patient supine. There is no rash, tenderness, lesion or injury on the right labia. There is no rash, tenderness, lesion or injury on the left labia. Cervix exhibits discharge. Cervix exhibits no motion tenderness and no friability. Right adnexum displays no mass, no tenderness and no fullness. Left adnexum displays no mass, no tenderness and no fullness. No erythema, tenderness or bleeding around the vagina. No foreign body around the vagina. No signs of injury around the vagina. No vaginal  discharge found.  Minimal yellow/ white mucoid discharge  Musculoskeletal: Normal range of motion.  Lymphadenopathy:       Right: No inguinal adenopathy present.       Left: No inguinal adenopathy present.  Neurological: She is alert and oriented to person, place, and time.  Skin: Skin is warm and dry. No rash noted.  Psychiatric: She has a normal mood and affect. Her behavior is normal.    ED Course  Procedures (including critical care time) Labs Review Labs Reviewed  POCT URINALYSIS DIP (DEVICE) - Abnormal; Notable for the following:    Hgb urine dipstick TRACE (*)    All other components within normal limits  POCT PREGNANCY, URINE   Imaging Review No results found.  EKG Interpretation    Date/Time:    Ventricular Rate:    PR Interval:    QRS Duration:   QT Interval:    QTC Calculation:   R Axis:     Text Interpretation:              MDM   UPT negative. UA unremarkable. Will treat empirically for BV and await additional culture results to determine if further treatment necessary. No clinical indication of PID.    Jess Barters [place], [place] 07/17/13 [phone]

## 2013-11-16 ENCOUNTER — Emergency Department (HOSPITAL_COMMUNITY)
Admission: EM | Admit: 2013-11-16 | Discharge: 2013-11-16 | Disposition: A | Discharge status: Home / Self Care | Payer: BC Managed Care – PPO | Source: Home / Self Care | Attending: Emergency Medicine | Admitting: Emergency Medicine

## 2013-11-16 ENCOUNTER — Encounter (HOSPITAL_COMMUNITY): Payer: Self-pay | Admitting: Emergency Medicine

## 2013-11-16 DIAGNOSIS — J309 Allergic rhinitis, unspecified: Secondary | ICD-10-CM

## 2013-11-16 MED ORDER — METHYLPREDNISOLONE ACETATE 80 MG/ML IJ SUSP
INTRAMUSCULAR | Status: AC
  Filled 2013-11-16: qty 1

## 2013-11-16 MED ORDER — PREDNISONE 20 MG PO TABS: ORAL_TABLET | ORAL | Status: DC

## 2013-11-16 MED ORDER — MONTELUKAST SODIUM 10 MG PO TABS: 10.0000 mg | ORAL_TABLET | Freq: Every day | ORAL | Status: DC

## 2013-11-16 MED ORDER — AZELASTINE HCL 0.05 % OP SOLN: 1.0000 [drp] | Freq: Two times a day (BID) | OPHTHALMIC | Status: DC

## 2013-11-16 MED ORDER — METHYLPREDNISOLONE ACETATE 80 MG/ML IJ SUSP
80.0000 mg | Freq: Once | INTRAMUSCULAR | Status: AC
  Administered 2013-11-16: 80 mg via INTRAMUSCULAR

## 2013-11-16 MED ORDER — FLUTICASONE PROPIONATE 50 MCG/ACT NA SUSP: 2.0000 | Freq: Every day | NASAL | Status: DC

## 2013-11-16 MED ORDER — LEVOCETIRIZINE DIHYDROCHLORIDE 5 MG PO TABS: 5.0000 mg | ORAL_TABLET | Freq: Every evening | ORAL | Status: DC

## 2013-11-16 NOTE — ED Provider Notes (Signed)
  Chief Complaint   Chief Complaint  Patient presents with  . Allergic Rhinitis     History of Present Illness   Diane [name] is a 23 year old female who has allergy symptoms for the past month. This tends to occur every spring time. She describes nasal congestion, clear rhinorrhea, sneezing, itchy, watery eyes, congested ears, itchy throat, and chest tightness. She denies any fever, chills, headache, wheezing, or asthma history.  Review of Systems   Other than as noted above, the patient denies any of the following symptoms. Systemic:  No fever, chills, or headache. Eye:  No redness, itching, watering, pain or drainage. ENT:  No earache, ear congestion, sinus pressure or pain, post nasal drip, or sore throat. Lungs:  No cough, sputum production, wheezing, or shortness of breath. Skin:  No rash or itching.  PMFSH   Past medical history, family history, social history, meds, and allergies were reviewed.    Physical Exam     Vital signs:  BP 117/82  Pulse 70  Temp(Src) 98.5 F (36.9 C) (Oral)  Resp 18  SpO2 100%  LMP 11/09/2013 General:  Alert, in no distress. Eye:  No conjunctival injection or drainage. Lids were normal. ENT:  TMs and canals were normal, without erythema or inflammation.  Nasal mucosa was congested, pale and boggy with clear drainage.  Mucous membranes were moist.  Pharynx was clear, without exudate or drainage.  There were no oral ulcerations or lesions. Neck:  Supple, no adenopathy, tenderness or mass. Lungs:  No respiratory distress.  Lungs were clear to auscultation, without wheezes, rales or rhonchi.  Breath sounds were clear and equal bilaterally. Heart:  Regular rhythm, without gallops, murmers or rubs. Skin:  Clear, warm, and dry, without rash or lesions.  Course in Urgent Care Center   The following medications were given:  Medications  methylPREDNISolone acetate (DEPO-MEDROL) injection 80 mg (80 mg Intramuscular Given 11/16/13 1156)    Assessment   The encounter diagnosis was Allergic rhinitis.  Plan     1.  Meds:  The following meds were prescribed:   Discharge Medication List as of 11/16/2013 11:41 AM    START taking these medications   Details  azelastine (OPTIVAR) 0.05 % ophthalmic solution Place 1 drop into both eyes 2 (two) times daily., Starting 11/16/2013, Until Discontinued, Normal    fluticasone (FLONASE) 50 MCG/ACT nasal spray Place 2 sprays into both nostrils daily., Starting 11/16/2013, Until Discontinued, Normal    levocetirizine (XYZAL) 5 MG tablet Take 1 tablet (5 mg total) by mouth every evening., Starting 11/16/2013, Until Discontinued, Normal    montelukast (SINGULAIR) 10 MG tablet Take 1 tablet (10 mg total) by mouth at bedtime., Starting 11/16/2013, Until Discontinued, Normal    predniSONE (DELTASONE) 20 MG tablet Take 3 daily for 7 days, 2 daily for 7 days, 1 daily for 7 days., Normal        2.  Patient Education/Counseling:  The patient was given appropriate handouts, self care instructions, and instructed in symptomatic relief. The patient was instructed in allergen avoidance.    3.  Follow up:  The patient was told to follow up here if no better in 3 to 4 days, or sooner if becoming worse in any way, and given some red flag symptoms such as fever or difficulty breathing which would prompt immediate return.  Follow up with Dr. Romulus CallasSharma if no improvement.        Reuben Likesavid C Secily Walthour, MD 11/16/13 [phone]

## 2013-11-16 NOTE — ED Notes (Signed)
Pt c/o severe allergies onset x1 month Sx include itchy eyes/throat, runny nose, congestion Denies f/v/n/d Taking OTC allergy meds; Claritin/Allegra w/no relief Alert w/no signs of acute distress.

## 2013-11-16 NOTE — Discharge Instructions (Signed)
People who suffer from allergies frequently have symptoms of nasal congestion, runny nose, sneezing, itching of the nose, eyes, ears or throat, mucous in the throat, watering of the eyes and cough.  These symptoms are caused by the body's immune response to environmental allergens.  For seasonal allergies this is pollen (tree pollen in the spring, grass pollen in the summer, and weed pollen in the fall).  Year round allergy symptoms are usually caused by dust or mould.  Many people have year round symptoms which are worse seasonally. ° °For people who have seasonal allergies, pollen avoidance may help to decease symptoms.  This means keeping windows in the house down and windows in the car up.  Run your air conditioning, since this filters out many of the pollen particles.  If you have to spend a prolonged time outdoors during heavy pollen season, it might be prudent to wear a mask.  These can be purchased at any drug store.  When you come in after heavy pollen exposure, your skin, clothing and hair are covered with pollen.  Changing your clothing, taking a shower, and washing your hair may help with your pollen exposure.  Also, your bedding, pillow, and pillowcase may become contaminated with pollen, so frequent washing of your bedding and pillowcase and changing out your pillow may help as well.  (Your pillow can also be a source of dust and mould exposure as well.)  Showering at bedtime may also help. ° °During heavy pollen season (April and September), a large amount of pollen gets trapped in your nasal cavity.  This can contribute to ongoing allergy symptoms.  Saline irrigation of the nasal cavity can help to remove this and relieve allergy symptoms.  This can be accomplished in several ways.  You can mix up your own saline solution using the following recipe:  8 oz of distilled or boiled water, 1/2 tsp of table salt (sodium choride), and a pinch of baking soda (sodium bicarbonate).  If nasal congestion is a  problem.  1 to 2 drops of Afrin solution can be added to this as well.  To do the irrigation, purchase a nasal bulb syringe (the kind you would use to clean out an infant's nose).  Fill this up with the solution, lean you head over a sink with the nostril to be irrigated turned upward, insert the syringe into your nostril, making a tight seal, and gently irrigate, compressing the bulb.  The solution will flow into your nostril and out the other, some may also come out of your mouth.  Repeat this on both sides.  You can do this once daily.  Do not store the solution, mix it up fresh each day.  A commercial solution, called Neomed Solution, can be purchased over the counter without prescription.  You can also use a Netti Pot for irrigation.  These can be purchased at your drug store as well.  Be sure to use distilled or boiled water in these as well and make sure the Netti pot is completely dry between uses. ° °Over the counter medications can be helpful, and in many cases can completely control allergy symptoms without resorting to more expensive prescription meds.   °Antihistamines are the mainstay of allergy treatment.  The newer non-sedating antihistamines are all available over the counter.  These include Allegra, Zyrtec, and Claritin which also can be purchased in their generic forms: fexofenadine, cetirizine, and  Cetirizine.  Combining these meds with a decongestant such as pseudoephedrine or   phenylephrine helps with nasal congestion, but decongestants can also cause elevations in blood pressure.  Pseudoephedrine tends to be more effective than phenylephrine.  The older, more sedating antihistamines such as chlorpheniramine, brompheniramine, and diphenhydramine are also very effective, sometimes more so than the newer antihistamines, but with the price of more sedation.  You should be careful about driving or operating heavy machinery when taking sedating antihistamines, and men with enlarged prostates may  experience urinary retention with diphenhydramine. ° °Naslacrom nasal spray can be very effective for allergy symptoms.  It is available over the counter and has very few side effects.  The dosage is 2 sprays in each nostril twice daily.  It is recommended that you pinch your nose shut for 30 seconds after using it since it is a watery spray and can run out.  It can be used as long as needed.  There is no risk of dependency. ° °For people with year round allergies, dust, mould, insect emanations, and pet dander are usually the culprits. ° °To avoid dust, you need to avoid dust mites which are the main source of allergens in house dust.  Cover your bedding with moisture and mite impervious covers.  These can be purchased at any mattress store.  The modern covers are a little expensive, but not at all uncomfortable. Keeping your house as dry as possible will also help to control dust mites.  Do not use a humidifier and it may help to use a dehumidifier.  Use of a HEPA filter air filter is also a great way to reduce dust and mold exposure.  These units can be purchased commercially.  Make sure to buy one large enough for the room you intend to use it.  Change the filter as per the manufacturer's instructions.  Also, using a HEPA filter vacuum for your carpets is helpful.  There are chemicals that you can sprinkle on your carpet called acaricides that will kill dist mites.  The most commonly used brand is Acarosan.  This can be purchased on line.  It does have to be periodically reapplied.  Wash you pillows and bedsheets regularly in hot water. ° ° ° ° ° ° ° ° ° °

## 2014-05-02 ENCOUNTER — Encounter (HOSPITAL_COMMUNITY): Payer: Self-pay | Admitting: Emergency Medicine

## 2014-08-01 ENCOUNTER — Ambulatory Visit (INDEPENDENT_AMBULATORY_CARE_PROVIDER_SITE_OTHER): Payer: BLUE CROSS/BLUE SHIELD | Admitting: Family

## 2014-08-01 ENCOUNTER — Encounter: Payer: Self-pay | Admitting: Family

## 2014-08-01 ENCOUNTER — Other Ambulatory Visit (INDEPENDENT_AMBULATORY_CARE_PROVIDER_SITE_OTHER): Payer: BLUE CROSS/BLUE SHIELD

## 2014-08-01 VITALS — BP 122/78 | HR 77 | Temp 98.5°F | Resp 18 | Ht 66.0 in | Wt 184.0 lb

## 2014-08-01 DIAGNOSIS — N898 Other specified noninflammatory disorders of vagina: Secondary | ICD-10-CM | POA: Insufficient documentation

## 2014-08-01 LAB — URINALYSIS, ROUTINE W REFLEX MICROSCOPIC
Bilirubin Urine: NEGATIVE
HGB URINE DIPSTICK: NEGATIVE
Ketones, ur: NEGATIVE
Leukocytes, UA: NEGATIVE
Nitrite: NEGATIVE
PH: 7.5 (ref 5.0–8.0)
RBC / HPF: NONE SEEN (ref 0–?)
Specific Gravity, Urine: 1.01 (ref 1.000–1.030)
TOTAL PROTEIN, URINE-UPE24: NEGATIVE
Urine Glucose: NEGATIVE
Urobilinogen, UA: 0.2 (ref 0.0–1.0)
WBC, UA: NONE SEEN (ref 0–?)

## 2014-08-01 LAB — RPR

## 2014-08-01 MED ORDER — FLUCONAZOLE 150 MG PO TABS: 150.0000 mg | ORAL_TABLET | Freq: Every day | ORAL | Status: DC

## 2014-08-01 NOTE — Progress Notes (Signed)
   Subjective:    Patient ID: Diane [name], female    DOB: Sep 20, 1990, 24 y.o.   MRN: [id number]  Chief Complaint  Patient presents with  . Establish Care    said shes got personal problems that she would like to discuss    HPI:  Diane RangerMorgane Werth is a 24 y.o. female who presents today to establish care and discuss vaginal discharge.  Associated symptoms of white, milk like vaginal discharge that occurs after intercourse has been going on for approximately 1 year. She was previously seen and treated at an Urgent Care and treated with Metronidazole which provided no changes. Results from Urgent Care notes no Candida or Trichimonas, a negative UA, and negative pregnancy test. She also notes a very strong odor to her urine.    Allergies  Allergen Reactions  . Coconut Flavor Rash  . Fish Allergy Rash  . Pineapple Rash    No current outpatient prescriptions on file prior to visit.   No current facility-administered medications on file prior to visit.    History reviewed. No pertinent past medical history.  Past Surgical History  Procedure Laterality Date  . Hernia repair      Family History  Problem Relation Age of Onset  . Family history unknown: Yes    History   Social History  . Marital Status: Single    Spouse Name: N/A    Number of Children: 0  . Years of Education: 15   Occupational History  . Seamer    Social History Main Topics  . Smoking status: Never Smoker   . Smokeless tobacco: Never Used  . Alcohol Use: No  . Drug Use: No     Comment: Discussed birth control options  . Sexual Activity: Yes    Birth Control/ Protection: None     Comment: Last intercourse 07/07/2012   Other Topics Concern  . Not on file   Social History Narrative   Fun: Dance   Denies religious beliefs effecting health care.     Review of Systems  Constitutional: Negative for fever and chills.  Gastrointestinal: Negative for abdominal pain.  Genitourinary: Positive for  flank pain and vaginal discharge. Negative for dysuria, urgency, frequency, hematuria, decreased urine volume, vaginal bleeding, difficulty urinating, genital sores, vaginal pain, menstrual problem, pelvic pain and dyspareunia.      Objective:    BP 122/78 mmHg  Pulse 77  Temp(Src) 98.5 F (36.9 C) (Oral)  Resp 18  Ht 5\' 6"  (1.676 m)  Wt 184 lb (83.462 kg)  BMI 29.71 kg/m2  SpO2 98% Nursing note and vital signs reviewed.  Physical Exam  Constitutional: She is oriented to person, place, and time. She appears well-developed and well-nourished. No distress.  Cardiovascular: Normal rate, regular rhythm, normal heart sounds and intact distal pulses.   Pulmonary/Chest: Effort normal and breath sounds normal.  Abdominal: Soft. Bowel sounds are normal. She exhibits no distension and no mass. There is no tenderness. There is no rebound and no guarding.  Neurological: She is alert and oriented to person, place, and time.  Skin: Skin is warm and dry.  Psychiatric: She has a normal mood and affect. Her behavior is normal. Judgment and thought content normal.       Assessment & Plan:

## 2014-08-01 NOTE — Assessment & Plan Note (Addendum)
Obtain GC/Chlymidia, HIV, RPR, pregnancy and UA. Will treat for candida until results return. Start Diflucan. Refer to GYN for annual visit and discharge since this has been a chronic concern. Patient instructed to follow up if symptoms worsen or abdominal pain or fever develop.

## 2014-08-01 NOTE — Progress Notes (Signed)
Pre visit review using our clinic review tool, if applicable. No additional management support is needed unless otherwise documented below in the visit note. 

## 2014-08-01 NOTE — Patient Instructions (Signed)
Thank you for choosing [facility].  Summary/Instructions:  Your prescription(s) have been submitted to your pharmacy or been printed and provided for you. Please take as directed and contact our office if you believe you are having problem(s) with the medication(s) or have any questions.  Please stop by the lab on the basement level of the building for your blood work. Your results will be released to MyChart (or called to you) after review, usually within 72 hours after test completion. If any changes need to be made, you will be notified at that same time.  If your symptoms worsen or fail to improve, please contact our office for further instruction, or in case of emergency go directly to the emergency room at the closest medical facility.     

## 2014-08-02 ENCOUNTER — Telehealth: Payer: Self-pay | Admitting: Family

## 2014-08-02 LAB — HIV ANTIBODY (ROUTINE TESTING W REFLEX): HIV 1&2 Ab, 4th Generation: NONREACTIVE

## 2014-08-02 LAB — PREGNANCY, URINE: PREG TEST UR: NEGATIVE

## 2014-08-02 LAB — GC/CHLAMYDIA PROBE AMP, URINE
CHLAMYDIA, SWAB/URINE, PCR: NEGATIVE
GC PROBE AMP, URINE: NEGATIVE

## 2014-08-02 NOTE — Telephone Encounter (Signed)
Please inform patient that all of her labs are negative for infection and please start taking the diflucan.

## 2014-08-03 NOTE — Telephone Encounter (Signed)
Called and left pt message to call back.

## 2014-08-05 NOTE — Telephone Encounter (Signed)
Pt aware of results 

## 2014-08-22 ENCOUNTER — Ambulatory Visit (INDEPENDENT_AMBULATORY_CARE_PROVIDER_SITE_OTHER): Payer: BLUE CROSS/BLUE SHIELD | Admitting: Obstetrics and Gynecology

## 2014-08-22 ENCOUNTER — Encounter: Payer: Self-pay | Admitting: Obstetrics and Gynecology

## 2014-08-22 ENCOUNTER — Other Ambulatory Visit: Payer: Self-pay | Admitting: Obstetrics and Gynecology

## 2014-08-22 VITALS — BP 102/68 | HR 84 | Resp 14 | Ht 68.0 in | Wt 186.6 lb

## 2014-08-22 DIAGNOSIS — N926 Irregular menstruation, unspecified: Secondary | ICD-10-CM

## 2014-08-22 DIAGNOSIS — N761 Subacute and chronic vaginitis: Secondary | ICD-10-CM

## 2014-08-22 LAB — CBC
HCT: 31.8 % — ABNORMAL LOW (ref 36.0–46.0)
Hemoglobin: 10.4 g/dL — ABNORMAL LOW (ref 12.0–15.0)
MCH: 27.7 pg (ref 26.0–34.0)
MCHC: 32.7 g/dL (ref 30.0–36.0)
MCV: 84.6 fL (ref 78.0–100.0)
MPV: 9.4 fL (ref 8.6–12.4)
Platelets: 307 10*3/uL (ref 150–400)
RBC: 3.76 MIL/uL — ABNORMAL LOW (ref 3.87–5.11)
RDW: 15.8 % — AB (ref 11.5–15.5)
WBC: 4.1 10*3/uL (ref 4.0–10.5)

## 2014-08-22 LAB — TSH: TSH: 2.635 u[IU]/mL (ref 0.350–4.500)

## 2014-08-22 LAB — POCT URINE PREGNANCY: Preg Test, Ur: NEGATIVE

## 2014-08-22 NOTE — Patient Instructions (Signed)
Please return for your full annual exam and discussion of results of your tests for today.

## 2014-08-22 NOTE — Progress Notes (Signed)
Patient ID: Diane [name], female   DOB: 12-May-1991, 24 y.o.   MRN: [id number] GYNECOLOGY VISIT  PCP:  Jeanine Luz, FNP  Referring provider:   HPI: 24 y.o.   Single  African American  female   G2P0010 with Patient's last menstrual period was 08/16/2014 (exact date).   here for vaginal discharge with odor--worse after intercourse.    Having vaginal odor before menses.  This occurs every month and sometimes when she voids.  No itching or burning.  No discharge.  Went to Urgent Care and took Flagyl and this did not help.  Went to [facility] and was treated with Diflucan daily.  She does not remember how many time she took this.  Had negative GC/CT, HIV, RPR. Never douching.   Menses coming every 14 - 20 days for 5 years since moved to [place] from [place].    From the [place].  Last for 4 - 5 days.  Has heavy flow and changes pad up to 3 - 4 times a day.  Has pain with cycles.  No prior evaluation.   Not using anything for pregnancy prevention.  Would accept pregnancy if it occurs.  Sometimes uses condoms, but not often.   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     UPT:  Negative  GYNECOLOGIC HISTORY: Patient's last menstrual period was 08/16/2014 (exact date). Sexually active: yes  Partner preference: female Contraception:  None  Menopausal hormone therapy: n/a DES exposure:  no  Blood transfusions:   no Sexually transmitted diseases: no GYN procedures and prior surgeries:  no Last mammogram: n/a               Last pap and high risk HPV testing:   never History of abnormal pap smear:  never   OB History    Gravida Para Term Preterm AB TAB SAB Ectopic Multiple Living   0       LIFESTYLE: Exercise:    no           Tobacco:   no Alcohol:   none Drug use:    none  Patient Active Problem List   Diagnosis Date Noted  . Vaginal discharge 08/01/2014    Past Medical History  Diagnosis Date   . Dysmenorrhea     Past Surgical History  Procedure Laterality Date  . Hernia repair      Current Outpatient Prescriptions  Medication Sig Dispense Refill  . fluconazole (DIFLUCAN) 150 MG tablet Take 1 tablet (150 mg total) by mouth daily. 3 tablet 0   No current facility-administered medications for this visit.     ALLERGIES: Coconut flavor; Fish allergy; and Pineapple  Family History  Problem Relation Age of Onset  . Hypertension Mother     History   Social History  . Marital Status: Single    Spouse Name: N/A  . Number of Children: 0  . Years of Education: 15   Occupational History  . Seamer    Social History Main Topics  . Smoking status: Never Smoker   . Smokeless tobacco: Never Used  . Alcohol Use: No  . Drug Use: No     Comment: Discussed birth control options  . Sexual Activity:    Partners: Male    Pharmacist, hospital Protection: None   Other Topics Concern  . Not on file   Social History Narrative   Fun:  Dance   Denies religious beliefs effecting health care.     ROS:  Pertinent items are noted in HPI.  PHYSICAL EXAMINATION:    BP 102/68 mmHg  Pulse 84  Resp 14  Ht 5\' 8"  (1.727 m)  Wt 186 lb 9.6 oz (84.641 kg)  BMI 28.38 kg/m2  LMP 08/16/2014 (Exact Date)   Wt Readings from Last 3 Encounters:  08/22/14 186 lb 9.6 oz (84.641 kg)  08/01/14 184 lb (83.462 kg)  07/15/12 164 lb (74.39 kg)     Ht Readings from Last 3 Encounters:  08/22/14 5\' 8"  (1.727 m)  08/01/14 5\' 6"  (1.676 m)  07/15/12 5' 6.5" (1.689 m)    General appearance: alert, cooperative and appears stated age   Abdomen: infraumbilical scar, soft, non-tender; no masses,  no organomegaly   Pelvic: External genitalia:  no lesions              Urethra:  normal appearing urethra with no masses, tenderness or lesions              Bartholins and Skenes: normal                 Vagina: normal appearing vagina with normal color and discharge, no lesions              Cervix: normal  appearance.  Light brown discharge.   Affirm done.                  Bimanual Exam:  Uterus:  uterus is normal size, shape, consistency and nontender                                      Adnexa: normal adnexa in size, nontender and no masses                               ASSESSMENT  Irregular menses.   UPT negative.  Chronic vaginitis.  Need for annual exam.   PLAN  TSH, prolactin, LH, FSH, CBC.  Affirm testing.  Discussed condom and spermicide use for pregnancy and STD prevention.  Condoms may also reduce vaginitis. I discussed regulation of cycles after evaluation is complete.  No douching.  Needs annual exam in 2 weeks and discussion of results and plan.   An After Visit Summary was printed and given to the patient.  30 minutes face to face time of which over 50% was spent in counseling.

## 2014-08-23 ENCOUNTER — Telehealth: Payer: Self-pay

## 2014-08-23 ENCOUNTER — Other Ambulatory Visit: Payer: Self-pay | Admitting: Obstetrics and Gynecology

## 2014-08-23 DIAGNOSIS — D509 Iron deficiency anemia, unspecified: Secondary | ICD-10-CM

## 2014-08-23 LAB — WET PREP BY MOLECULAR PROBE
CANDIDA SPECIES: NEGATIVE
GARDNERELLA VAGINALIS: POSITIVE — AB
TRICHOMONAS VAG: NEGATIVE

## 2014-08-23 LAB — FSH/LH
FSH: 5.7 m[IU]/mL
LH: 7.4 m[IU]/mL

## 2014-08-23 LAB — IRON: IRON: 15 ug/dL — AB (ref 42–145)

## 2014-08-23 LAB — PROLACTIN: Prolactin: 9.8 ng/mL

## 2014-08-23 MED ORDER — METRONIDAZOLE 500 MG PO TABS: 500.0000 mg | ORAL_TABLET | Freq: Two times a day (BID) | ORAL | Status: DC

## 2014-08-23 NOTE — Telephone Encounter (Signed)
Left message to call Kaitlyn at [phone]. 

## 2014-08-23 NOTE — Telephone Encounter (Signed)
Spoke with patient. Results given as seen below. Patient is agreeable and verbalizes understanding. [facility] Flag[facility] 500 mg bid 7 [facility] Advised patient she will need to avoid alcohol while taking this prescription. Patient is agreeable.  Routing to provider for final review. Patient agreeable to disposition. Will close encounter

## 2014-08-23 NOTE — Telephone Encounter (Signed)
-----   Message from MinaBrook E Amundson de Gwenevere Ghaziarvalho E Silva, MD sent at 08/23/2014  8:41 AM EST ----- Please call patient and report results:  Affirm showed bacterial vaginosis. I would like to offer the patient Flagyl 500 mg po bid for 7 days. Please send this to the pharmacy of her choice.   She is also anemic. I am adding an iron level to her blood work done yesterday, and we will call her with this result and a plan.  The TSH and prolactin were normal.  I suspect that she is having irregular menses due to an irregular ovulatory pattern [facility].  Cc - Claudette LawsAmanda Dixon

## 2014-08-24 ENCOUNTER — Telehealth: Payer: Self-pay

## 2014-08-24 NOTE — Telephone Encounter (Signed)
Left message to call Diane [name] at [phone]. 

## 2014-08-24 NOTE — Telephone Encounter (Signed)
-----   Message from TecumsehBrook E Amundson de Gwenevere Ghaziarvalho E Silva, MD sent at 08/23/2014 10:11 PM EST ----- Please inform the patient of her low iron level.  She has iron deficiency anemia.  I recommend iron sulfate 325 mg by mouth once daily.  Take with orange juice to help with the absorption.  This may constipate and patient may need to take a stool softener, like Colace 100 mg, daily.  We can recheck her Hgb and iron level when she comes back for her annual examination in March.   Cc - Claudette LawsAmanda Dixon

## 2014-08-26 NOTE — Telephone Encounter (Signed)
Attempted to reach patient at number provided [phone] with no answer. Unable to leave voicemail. Will try again later.

## 2014-08-29 NOTE — Telephone Encounter (Signed)
Spoke with patient. Results given as seen below from Dr.Silva. Patient is agreeable and verbalizes understanding. Patient will begin taking iron 325mg  daily at this time. Annual scheduled for 09/09/2014 with Dr.Silva. Aware will recheck these levels at that time. Patient states that she has been experiencing headaches off and on while taking Flagyl rx. Denies any vision changes, weakness, nausea, or vomiting. Patient denies any headaches at this time. Patient will complete medication today. "I am really fine today." Advised if symptoms continue or develops and new symptoms will need to give our office a call. Patient is agreeable.  Routing to provider for final review. Patient agreeable to disposition. Will close encounter

## 2014-09-09 ENCOUNTER — Ambulatory Visit (INDEPENDENT_AMBULATORY_CARE_PROVIDER_SITE_OTHER): Payer: BLUE CROSS/BLUE SHIELD | Admitting: Obstetrics and Gynecology

## 2014-09-09 ENCOUNTER — Encounter: Payer: Self-pay | Admitting: Obstetrics and Gynecology

## 2014-09-09 VITALS — BP 136/78 | HR 82 | Resp 16 | Ht 68.25 in | Wt 186.0 lb

## 2014-09-09 DIAGNOSIS — N63 Unspecified lump in breast: Secondary | ICD-10-CM

## 2014-09-09 DIAGNOSIS — N6320 Unspecified lump in the left breast, unspecified quadrant: Secondary | ICD-10-CM

## 2014-09-09 DIAGNOSIS — Z01419 Encounter for gynecological examination (general) (routine) without abnormal findings: Secondary | ICD-10-CM

## 2014-09-09 DIAGNOSIS — D509 Iron deficiency anemia, unspecified: Secondary | ICD-10-CM

## 2014-09-09 DIAGNOSIS — Z0000 Encounter for general adult medical examination without abnormal findings: Secondary | ICD-10-CM

## 2014-09-09 DIAGNOSIS — N926 Irregular menstruation, unspecified: Secondary | ICD-10-CM | POA: Diagnosis not present

## 2014-09-09 DIAGNOSIS — N6310 Unspecified lump in the right breast, unspecified quadrant: Secondary | ICD-10-CM

## 2014-09-09 MED ORDER — NORETHIN ACE-ETH ESTRAD-FE 1-20 MG-MCG PO TABS: 1.0000 | ORAL_TABLET | Freq: Every day | ORAL | Status: DC

## 2014-09-09 NOTE — Progress Notes (Signed)
Patient ID: Diane [name], female   DOB: [birth year], 24 y.o.   MRN: [id number] 24 y.o. G2P0010 SingleAfrican AmericanF here for annual exam.    Negative STD testing through PCP.   Irregular menses. Cycle started again on 09/06/14.  Bleeding today with cramping.  Labs 2 weeks ago showed normal TSH and prolactin, LH a little higher than FSH.  UPT was negative.   Hgb 10.4 and Fe 15 when she was here  2 weeks ago.  Did not start iron replacement.   No famliy history of DVT/PE.   PCP:  Diane LuzGregory Calone, MD  Patient's last menstrual period was 08/16/2014 (exact date).          Sexually active: Yes.   female partner The current method of family planning is none.    Exercising: No.  The patient does not participate in regular exercise at present. Smoker:  no  Health Maintenance: Pap:  Never History of abnormal Pap:  no MMG:  n/a Colonoscopy:  n/a BMD:   n/a TDaP:  Never had one? Urine today: unable to void   reports that she has never smoked. She has never used smokeless tobacco. She reports that she does not drink alcohol or use illicit drugs.  Past Medical History  Diagnosis Date  . Dysmenorrhea     Past Surgical History  Procedure Laterality Date  . Hernia repair      Current Outpatient Prescriptions  Medication Sig Dispense Refill  . fluconazole (DIFLUCAN) 150 MG tablet Take 1 tablet (150 mg total) by mouth daily. 3 tablet 0  . metroNIDAZOLE (FLAGYL) 500 MG tablet Take 1 tablet (500 mg total) by mouth 2 (two) times daily. 14 tablet 0   No current facility-administered medications for this visit.    Family History  Problem Relation Age of Onset  . Hypertension Mother     ROS:  Pertinent items are noted in HPI.  Otherwise, a comprehensive ROS was negative.  Exam:   LMP 08/16/2014 (Exact Date)        Ht Readings from Last 3 Encounters:  08/22/14 5\' 8"  (1.727 m)  08/01/14 5\' 6"  (1.676 m)  07/15/12 5' 6.5" (1.689 m)    General appearance: alert, cooperative and  appears stated age Head: Normocephalic, without obvious abnormality, atraumatic Neck: no adenopathy, supple, symmetrical, trachea midline and thyroid normal to inspection and palpation Lungs: clear to auscultation bilaterally Breasts: normal appearance, no masses or tenderness, Right breast with 3 cm mass at 10:00, no nodes, retractions, or nipple discharge.  Left breast with 3 cm mass at 2:00, no nodes, retractions, or nipple discharge.  (States had ultrasound many years ago but did not follow through with follow up exams.) Heart: regular rate and rhythm Abdomen: soft, non-tender; bowel sounds normal; no masses,  no organomegaly Extremities: extremities normal, atraumatic, no cyanosis or edema Skin: Skin color, texture, turgor normal. No rashes or lesions Lymph nodes: Cervical, supraclavicular, and axillary nodes normal. No abnormal inguinal nodes palpated Neurologic: Grossly normal   Pelvic: External genitalia:  no lesions              Urethra:  normal appearing urethra with no masses, tenderness or lesions              Bartholins and Skenes: normal                 Vagina: normal appearing vagina with normal color and discharge, no lesions  Cervix: Moderatie menstrual flow noted.  No lesions.  Unable to do pap.               Pap taken: No. Bimanual Exam:  Uterus:  normal size, contour, position, consistency, mobility, non-tender              Adnexa: normal adnexa and no mass, fullness, tenderness               Rectovaginal: Confirms               Anus:  normal sphincter tone, no lesions  Chaperone was present for exam.  A:  Well Woman with normal exam Irregular menses, currently on cycle.  Anovulatory bleeding. Labs reviewed.  Iron deficiency anemia.  Bilateral breast masses.  P:   Bilateral breast ultrasound at [facility].  Discussed methods of period regulation - periodic Provera versus hormonal contraception options.  Patient prefers to do a trial of OCP.  Rx for  LoEstring 1/20 Fe which she will not start until after breast evaluation complete and OK for hormonal medication.  pap smear to be done in 3 months when returns for follow up exam.  Will need CBC with Fe then as well.  Patient instructed to take iron sulfate or iron gluconate 325 mg twice daily with meals.  Discussed constipation and how she may need to reduce to once daily with meals if not tolerated well.  return annually or prn  Additional 10 minutes spent discussion bilateral breast masses and irregular menses.  Over 50% of time was spent in counseling.

## 2014-09-09 NOTE — Patient Instructions (Signed)

## 2014-09-16 ENCOUNTER — Ambulatory Visit
Admission: RE | Admit: 2014-09-16 | Discharge: 2014-09-16 | Disposition: A | Discharge status: Home / Self Care | Payer: BLUE CROSS/BLUE SHIELD | Source: Ambulatory Visit | Attending: Obstetrics and Gynecology | Admitting: Obstetrics and Gynecology

## 2014-09-16 ENCOUNTER — Encounter (INDEPENDENT_AMBULATORY_CARE_PROVIDER_SITE_OTHER): Payer: Self-pay

## 2014-09-16 DIAGNOSIS — N6310 Unspecified lump in the right breast, unspecified quadrant: Secondary | ICD-10-CM

## 2014-09-16 DIAGNOSIS — N6320 Unspecified lump in the left breast, unspecified quadrant: Secondary | ICD-10-CM

## 2014-09-20 ENCOUNTER — Telehealth: Payer: Self-pay | Admitting: Emergency Medicine

## 2014-09-20 NOTE — Telephone Encounter (Signed)
Spoke with patient and message from Dr. Edward JollySilva given. She verbalized understanding and will start birth control pills with next cycle on the first day. Routing to provider for final review. Patient agreeable to disposition. Will close encounter

## 2014-09-20 NOTE — Telephone Encounter (Signed)
-----   Message from Patton SallesBrook E Amundson C Silva, MD sent at 09/19/2014  6:26 PM EDT ----- Breast imaging results reviewed.  OK to remove from mammogram hold.  Next mammogram at age 24 - routine screening.   Please inform patient that she may start her birth control pills with her next menstruation.

## 2014-10-17 ENCOUNTER — Telehealth: Payer: Self-pay | Admitting: Obstetrics and Gynecology

## 2014-10-17 NOTE — Telephone Encounter (Signed)
Provider cx. Left voicemail for pt to cb and rs. Pt scheduled for 3 month follow up.

## 2014-11-03 NOTE — Telephone Encounter (Signed)
Spoke with patient and rescheduled her 3 month f/u and pap appointment for 11-18-14 at 9:30.

## 2014-11-18 ENCOUNTER — Ambulatory Visit (INDEPENDENT_AMBULATORY_CARE_PROVIDER_SITE_OTHER): Payer: BLUE CROSS/BLUE SHIELD | Admitting: Obstetrics and Gynecology

## 2014-11-18 ENCOUNTER — Encounter: Payer: Self-pay | Admitting: Obstetrics and Gynecology

## 2014-11-18 VITALS — BP 110/76 | HR 66 | Ht 68.25 in | Wt 187.8 lb

## 2014-11-18 DIAGNOSIS — D509 Iron deficiency anemia, unspecified: Secondary | ICD-10-CM | POA: Diagnosis not present

## 2014-11-18 DIAGNOSIS — D649 Anemia, unspecified: Secondary | ICD-10-CM | POA: Diagnosis not present

## 2014-11-18 DIAGNOSIS — Z124 Encounter for screening for malignant neoplasm of cervix: Secondary | ICD-10-CM

## 2014-11-18 DIAGNOSIS — Z3041 Encounter for surveillance of contraceptive pills: Secondary | ICD-10-CM

## 2014-11-18 NOTE — Addendum Note (Signed)
Addended by: Loney LaurenceHERNANDEZ, Edwinna Rochette S on: 11/18/2014 01:39 PM   Modules accepted: Orders

## 2014-11-18 NOTE — Progress Notes (Signed)
Patient ID: [name], female   DOB: 1990/11/30, 24 y.o.   MRN: [id number] GYNECOLOGY  VISIT   HPI: 24 y.o.   Single  African American  female   G2P0010 with Patient's last menstrual period was 11/16/2014 (exact date).   here for 3 month follow up and pap smear.    Patient was seen for annual exam and could not have pap due to bleeding.   Diagnosed with anemia at that time.  Hgb was 10.4. Taking Fe once daily.   Started on birth control for anovulatory bleeding.  Menses now about every 25 days and they are lighter.  Cramping only the first day.  Happy with this pill.  Denies problems.  GYNECOLOGIC HISTORY: Patient's last menstrual period was 11/16/2014 (exact date). Contraception:OCPs--LoEstrin Fe Menopausal hormone therapy: n/a Last mammogram:  09-16-14 1. Interval mild decrease in size of the previously biopsied benign mass in the 3 o'clock position of the left breast. 2. Interval decrease in size of two additional similar-appearing masses in the left breast. The decrease in size since 2011 is compatible with a benign process for both masses. 3. Interval mild increase in size of a similar appearing mass in the right breast with 2 additional smaller, similar appearing masses seen today. The multiplicity of similar appearing masses in both breasts is compatible with a benign process. 4. No findings suspicious for malignancy in either breast.:[facility]  Last pap smear: Never        OB History    Gravida Para Term Preterm AB TAB SAB Ectopic Multiple Living   2    1  1    0         Patient Active Problem List   Diagnosis Date Noted  . Vaginal discharge 08/01/2014    Past Medical History  Diagnosis Date  . Dysmenorrhea     Past Surgical History  Procedure Laterality Date  . Hernia repair      Current Outpatient Prescriptions  Medication Sig Dispense Refill  . ferrous fumarate (HEMOCYTE - 106 MG FE) 325 (106 FE) MG TABS tablet Take 1 tablet by mouth  daily.    . norethindrone-ethinyl estradiol (JUNEL FE,GILDESS FE,LOESTRIN FE) 1-20 MG-MCG tablet Take 1 tablet by mouth daily. 3 Package 3   No current facility-administered medications for this visit.     ALLERGIES: Coconut flavor; Fish allergy; and Pineapple  Family History  Problem Relation Age of Onset  . Hypertension Mother     History   Social History  . Marital Status: Single    Spouse Name: N/A  . Number of Children: 0  . Years of Education: 15   Occupational History  . Seamer    Social History Main Topics  . Smoking status: Never Smoker   . Smokeless tobacco: Never Used  . Alcohol Use: No  . Drug Use: No     Comment: Discussed birth control options  . Sexual Activity:    Partners: Male    Birth Control/ Protection: OCP     Comment: Loestrin Fe   Other Topics Concern  . Not on file   Social History Narrative   Fun: Dance   Denies religious beliefs effecting health care.     ROS:  Pertinent items are noted in HPI.  PHYSICAL EXAMINATION:    BP 110/76 mmHg  Pulse 66  Ht 5' 8.25" (1.734 m)  Wt 187 lb 12.8 oz (85.186 kg)  BMI 28.33 kg/m2  LMP 11/16/2014 (Exact Date)    General appearance:  alert, cooperative and appears stated age  Pelvic: External genitalia:  no lesions              Urethra:  normal appearing urethra with no masses, tenderness or lesions              Bartholins and Skenes: normal                 Vagina: normal appearing vagina with normal color and discharge, no lesions              Cervix: no lesions and Minimal amount of blood noted.              Pap taken: Yes.   Bimanual Exam:  Uterus:  normal size, contour, position, consistency, mobility, non-tender              Adnexa: normal adnexa and no mass, fullness, tenderness              Chaperone was present for exam.  ASSESSMENT  Oral contraceptive surveillance.  Doing well with combined oral contraceptives - LoEstrin 1/20. Benign fibrocystic disease of the breasts.     PLAN  Pap done with reflex HR HPV if ASCUS.  Has refills of LoEstrin 1/20 for one year.  Will check CBC and Fe now.  Follow up yearly for annual exams.    An After Visit Summary was printed and given to the patient.  ___15___ minutes face to face time of which over 50% was spent in counseling.

## 2014-11-19 ENCOUNTER — Other Ambulatory Visit: Payer: Self-pay | Admitting: Obstetrics and Gynecology

## 2014-11-19 DIAGNOSIS — D72819 Decreased white blood cell count, unspecified: Secondary | ICD-10-CM

## 2014-11-19 LAB — CBC
HCT: 35.9 % — ABNORMAL LOW (ref 36.0–46.0)
Hemoglobin: 11.5 g/dL — ABNORMAL LOW (ref 12.0–15.0)
MCH: 28.3 pg (ref 26.0–34.0)
MCHC: 32 g/dL (ref 30.0–36.0)
MCV: 88.4 fL (ref 78.0–100.0)
MPV: 9.5 fL (ref 8.6–12.4)
PLATELETS: 275 10*3/uL (ref 150–400)
RBC: 4.06 MIL/uL (ref 3.87–5.11)
RDW: 17.7 % — ABNORMAL HIGH (ref 11.5–15.5)
WBC: 2.8 10*3/uL — ABNORMAL LOW (ref 4.0–10.5)

## 2014-11-19 LAB — IRON: Iron: 94 ug/dL (ref 42–145)

## 2014-11-22 LAB — IPS PAP TEST WITH REFLEX TO HPV

## 2014-12-09 ENCOUNTER — Ambulatory Visit: Payer: BLUE CROSS/BLUE SHIELD | Admitting: Obstetrics and Gynecology

## 2014-12-19 ENCOUNTER — Other Ambulatory Visit: Payer: BLUE CROSS/BLUE SHIELD

## 2014-12-20 ENCOUNTER — Other Ambulatory Visit (INDEPENDENT_AMBULATORY_CARE_PROVIDER_SITE_OTHER): Payer: BLUE CROSS/BLUE SHIELD

## 2014-12-20 DIAGNOSIS — D72819 Decreased white blood cell count, unspecified: Secondary | ICD-10-CM

## 2014-12-20 LAB — CBC WITH DIFFERENTIAL/PLATELET
BASOS PCT: 1 % (ref 0–1)
Basophils Absolute: 0.1 10*3/uL (ref 0.0–0.1)
EOS ABS: 0.1 10*3/uL (ref 0.0–0.7)
Eosinophils Relative: 1 % (ref 0–5)
HCT: 37.2 % (ref 36.0–46.0)
HEMOGLOBIN: 12.6 g/dL (ref 12.0–15.0)
LYMPHS ABS: 1.7 10*3/uL (ref 0.7–4.0)
Lymphocytes Relative: 31 % (ref 12–46)
MCH: 29.6 pg (ref 26.0–34.0)
MCHC: 33.9 g/dL (ref 30.0–36.0)
MCV: 87.5 fL (ref 78.0–100.0)
MONO ABS: 0.6 10*3/uL (ref 0.1–1.0)
MPV: 9.5 fL (ref 8.6–12.4)
Monocytes Relative: 11 % (ref 3–12)
NEUTROS PCT: 56 % (ref 43–77)
Neutro Abs: 3 10*3/uL (ref 1.7–7.7)
PLATELETS: 302 10*3/uL (ref 150–400)
RBC: 4.25 MIL/uL (ref 3.87–5.11)
RDW: 16.3 % — ABNORMAL HIGH (ref 11.5–15.5)
WBC: 5.4 10*3/uL (ref 4.0–10.5)

## 2015-07-02 NOTE — L&D Delivery Note (Signed)
[age] y.o. G2P0010 at 4627w1d delivered a viable female infant in cephalic, LOA position. No nuchal cord. Right anterior shoulder delivered with ease. Baby was vigorously crying upon delivery. 60 sec delayed cord clamping. Cord clamped x2 and cut. Placenta delivered spontaneously intact, with 3VC. Fundus firm on exam with massage and pitocin. Good hemostasis noted.  Laceration: 3rd degree, class A (<50% EAS torn) Suture: 3-0 Vicryl; 3-0 Monocryl Good hemostasis noted.  Mom and baby recovering in LDR.    Apgars: 9/9 Weight: 2780g (6#2.1oz) EBL: 350 cc    Jen MowElizabeth Nikia Levels, DO [facility] Fellow [facility] 04/16/2016, 8:14 AM

## 2015-09-04 ENCOUNTER — Emergency Department (HOSPITAL_COMMUNITY)
Admission: EM | Admit: 2015-09-04 | Discharge: 2015-09-04 | Disposition: A | Discharge status: Home / Self Care | Payer: BLUE CROSS/BLUE SHIELD | Source: Home / Self Care | Attending: Family Medicine | Admitting: Family Medicine

## 2015-09-04 ENCOUNTER — Encounter (HOSPITAL_COMMUNITY): Payer: Self-pay | Admitting: *Deleted

## 2015-09-04 DIAGNOSIS — Z3201 Encounter for pregnancy test, result positive: Secondary | ICD-10-CM

## 2015-09-04 DIAGNOSIS — Z331 Pregnant state, incidental: Secondary | ICD-10-CM

## 2015-09-04 DIAGNOSIS — R110 Nausea: Secondary | ICD-10-CM | POA: Diagnosis not present

## 2015-09-04 LAB — POCT PREGNANCY, URINE: Preg Test, Ur: POSITIVE — AB

## 2015-09-04 MED ORDER — DOXYLAMINE-PYRIDOXINE 10-10 MG PO TBEC: 1.0000 | DELAYED_RELEASE_TABLET | Freq: Two times a day (BID) | ORAL | Status: DC

## 2015-09-04 NOTE — ED Notes (Signed)
Pt  Is  Late  On  Her  Period   She had  A  positive  Pregnancy  Test  At  Home  She  Reports  Some  Nausea     -  No  Vomiting    For  About  1  Week   She  Appears  In no  accute distress   A this  Time  Ambulating  With a  Steady  Fluid gait

## 2015-09-04 NOTE — Discharge Instructions (Signed)
Drink plenty of noncaffeinated drinks , use medicine as needed, see your doctor  As needed.

## 2015-09-04 NOTE — ED Provider Notes (Signed)
CSN: [id number]     Arrival date & time 09/04/15  1349 History   First MD Initiated Contact with Patient 09/04/15 1656     Chief Complaint  Patient presents with  . Possible Pregnancy   (Consider location/radiation/quality/duration/timing/severity/associated sxs/prior Treatment) Patient is a 25 y.o. female presenting with pregnancy problem. The history is provided by the patient.  Possible Pregnancy This is a new problem. The current episode started more than 1 week ago (lmp end of jan, G2P0). The problem has not changed since onset.Pertinent negatives include no chest pain, no abdominal pain, no headaches and no shortness of breath.    Past Medical History  Diagnosis Date  . Dysmenorrhea    Past Surgical History  Procedure Laterality Date  . Hernia repair     Family History  Problem Relation Age of Onset  . Hypertension Mother    Social History  Substance Use Topics  . Smoking status: Never Smoker   . Smokeless tobacco: Never Used  . Alcohol Use: No   OB History    Gravida Para Term Preterm AB TAB SAB Ectopic Multiple Living   2    1  1    0     Review of Systems  Constitutional: Negative.   Respiratory: Negative for shortness of breath.   Cardiovascular: Negative for chest pain.  Gastrointestinal: Positive for nausea. Negative for abdominal pain and diarrhea.  Genitourinary: Positive for menstrual problem. Negative for vaginal bleeding and pelvic pain.  Musculoskeletal: Negative.   Neurological: Negative for headaches.  All other systems reviewed and are negative.   Allergies  Coconut flavor; Fish allergy; and Pineapple  Home Medications   Prior to Admission medications   Medication Sig Start Date End Date Taking? Authorizing Provider  Doxylamine-Pyridoxine 10-10 MG TBEC Take 1 tablet by mouth 2 (two) times daily after a meal. 09/04/15   Linna HoffJames D Kindl, MD  ferrous fumarate (HEMOCYTE - 106 MG FE) 325 (106 FE) MG TABS tablet Take 1 tablet by mouth daily.     Historical Provider, MD  norethindrone-ethinyl estradiol (JUNEL FE,GILDESS FE,LOESTRIN FE) 1-20 MG-MCG tablet Take 1 tablet by mouth daily. 09/09/14   Brook Rosalin HawkingE Amundson C Silva, MD   Meds Ordered and Administered this Visit  Medications - No data to display  BP 130/80 mmHg  Pulse 76  Temp(Src) 98.1 F (36.7 C) (Oral)  Resp 18  SpO2 100% No data found.   Physical Exam  Constitutional: She is oriented to person, place, and time. She appears well-developed and well-nourished. No distress.  Abdominal: Soft. Bowel sounds are normal. There is no tenderness.  Neurological: She is alert and oriented to person, place, and time.  Skin: Skin is warm.  Nursing note and vitals reviewed.   ED Course  Procedures (including critical care time)  Labs Review Labs Reviewed  POCT PREGNANCY, URINE - Abnormal; Notable for the following:    Preg Test, Ur POSITIVE (*)    All other components within normal limits    Imaging Review No results found.   Visual Acuity Review  Right Eye Distance:   Left Eye Distance:   Bilateral Distance:    Right Eye Near:   Left Eye Near:    Bilateral Near:         MDM   1. Pregnancy as incidental finding        Linna HoffJames D Kindl, MD 09/04/15 [phone]

## 2015-09-05 ENCOUNTER — Encounter (HOSPITAL_COMMUNITY): Payer: Self-pay | Admitting: *Deleted

## 2015-09-05 ENCOUNTER — Inpatient Hospital Stay (HOSPITAL_COMMUNITY): Payer: BLUE CROSS/BLUE SHIELD

## 2015-09-05 ENCOUNTER — Inpatient Hospital Stay (HOSPITAL_COMMUNITY)
Admission: AD | Admit: 2015-09-05 | Discharge: 2015-09-05 | Disposition: A | Discharge status: Home / Self Care | Payer: BLUE CROSS/BLUE SHIELD | Source: Ambulatory Visit | Attending: Family Medicine | Admitting: Family Medicine

## 2015-09-05 DIAGNOSIS — Z3A01 Less than 8 weeks gestation of pregnancy: Secondary | ICD-10-CM | POA: Insufficient documentation

## 2015-09-05 DIAGNOSIS — O26899 Other specified pregnancy related conditions, unspecified trimester: Secondary | ICD-10-CM

## 2015-09-05 DIAGNOSIS — O26891 Other specified pregnancy related conditions, first trimester: Secondary | ICD-10-CM | POA: Insufficient documentation

## 2015-09-05 DIAGNOSIS — O008 Other ectopic pregnancy without intrauterine pregnancy: Secondary | ICD-10-CM

## 2015-09-05 DIAGNOSIS — K429 Umbilical hernia without obstruction or gangrene: Secondary | ICD-10-CM | POA: Diagnosis not present

## 2015-09-05 DIAGNOSIS — R1030 Lower abdominal pain, unspecified: Secondary | ICD-10-CM | POA: Insufficient documentation

## 2015-09-05 DIAGNOSIS — R109 Unspecified abdominal pain: Secondary | ICD-10-CM

## 2015-09-05 LAB — URINALYSIS, ROUTINE W REFLEX MICROSCOPIC
Bilirubin Urine: NEGATIVE
GLUCOSE, UA: NEGATIVE mg/dL
Hgb urine dipstick: NEGATIVE
Ketones, ur: NEGATIVE mg/dL
LEUKOCYTES UA: NEGATIVE
NITRITE: NEGATIVE
PH: 6.5 (ref 5.0–8.0)
Protein, ur: NEGATIVE mg/dL
SPECIFIC GRAVITY, URINE: 1.015 (ref 1.005–1.030)

## 2015-09-05 LAB — CBC
HEMATOCRIT: 34 % — AB (ref 36.0–46.0)
HEMOGLOBIN: 11.5 g/dL — AB (ref 12.0–15.0)
MCH: 28.5 pg (ref 26.0–34.0)
MCHC: 33.8 g/dL (ref 30.0–36.0)
MCV: 84.4 fL (ref 78.0–100.0)
Platelets: 304 10*3/uL (ref 150–400)
RBC: 4.03 MIL/uL (ref 3.87–5.11)
RDW: 14.7 % (ref 11.5–15.5)
WBC: 4.3 10*3/uL (ref 4.0–10.5)

## 2015-09-05 LAB — HCG, QUANTITATIVE, PREGNANCY: hCG, Beta Chain, Quant, S: 41030 m[IU]/mL — ABNORMAL HIGH (ref <5)

## 2015-09-05 LAB — WET PREP, GENITAL
CLUE CELLS WET PREP: NONE SEEN
SPERM: NONE SEEN
TRICH WET PREP: NONE SEEN
Yeast Wet Prep HPF POC: NONE SEEN

## 2015-09-05 LAB — ABO/RH: ABO/RH(D): B POS

## 2015-09-05 NOTE — MAU Provider Note (Signed)
Chief Complaint: Abdominal Pain and Flank Pain   First Provider Initiated Contact with Patient 09/05/15 1229        SUBJECTIVE  HPI  Diane [name] is a 25 y.o. G3P0010 at [redacted]w[redacted]d by LMP who presents to maternity admissions reporting lower abdominal pain for about a week. Denies bleeding. Was seen in Urgent Care yesterday and was told they could not treat her and that she needed to come here.  She denies vaginal bleeding, vaginal itching/burning, urinary symptoms, h/a, dizziness, n/v, or fever/chills.    RN Note: Pt C/O lower abd pain for the last 4 days, also R flank pain. Had pos UPT @ Urgent Care yesterday. Denies vaginal bleeding. No appetitie.        Past Medical History  Diagnosis Date  . Dysmenorrhea    Past Surgical History  Procedure Laterality Date  . Hernia repair     Social History   Social History  . Marital Status: Single    Spouse Name: N/A  . Number of Children: 0  . Years of Education: 15   Occupational History  . Seamer    Social History Main Topics  . Smoking status: Never Smoker   . Smokeless tobacco: Never Used  . Alcohol Use: No  . Drug Use: No     Comment: Discussed birth control options  . Sexual Activity:    Partners: Male    Birth Control/ Protection: OCP     Comment: Loestrin Fe   Other Topics Concern  . Not on file   Social History Narrative   Fun: Dance   Denies religious beliefs effecting health care.    No current facility-administered medications on file prior to encounter.   Current Outpatient Prescriptions on File Prior to Encounter  Medication Sig Dispense Refill  . Doxylamine-Pyridoxine 10-10 MG TBEC Take 1 tablet by mouth 2 (two) times daily after a meal. 60 tablet 1  . ferrous fumarate (HEMOCYTE - 106 MG FE) 325 (106 FE) MG TABS tablet Take 1 tablet by mouth daily.    . norethindrone-ethinyl estradiol (JUNEL FE,GILDESS FE,LOESTRIN FE) 1-20 MG-MCG tablet Take 1 tablet by mouth daily. 3 Package 3   Allergies   Allergen Reactions  . Coconut Flavor Rash  . Fish Allergy Rash  . Pineapple Rash    I have reviewed patient's Past Medical Hx, Surgical Hx, Family Hx, Social Hx, medications and allergies.   ROS:  Review of Systems   Constitutional: Negative for fever and chills.  Gastrointestinal: Negative for nausea, vomiting, diarrhea and constipation. Positive for abdominal pain,  Genitourinary: Negative for dysuria.  Musculoskeletal: Negative for back pain.  Neurological: Negative for dizziness and weakness.    Physical Exam  Patient Vitals for the past 24 hrs:  BP Temp Temp src Pulse Resp Height Weight  09/05/15 1159 118/64 mmHg 98 F (36.7 C) Oral 84 16  (1.676 m) 191 lb 3.2 oz (86.728 kg)    Physical Exam  Constitutional: Well-developed, well-nourished female in no acute distress.  Cardiovascular: normal rate Respiratory: normal effort GI: Abd soft, tender over lower abdomen. Pos BS x 4 MS: Extremities nontender, no edema, normal ROM Neurologic: Alert and oriented x 4.  GU: Neg CVAT.  PELVIC EXAM: Cervix pink, visually closed, without lesion, small amount dark red discharge at os, vaginal walls and external genitalia normal Bimanual exam: Cervix 0/long/high, firm, anterior, neg CMT, uterus nontender, nonenlarged, adnexa without tenderness, enlargement, or mass   LAB RESULTS Results for orders placed or performed during the  hospital encounter of 09/05/15 (from the past 24 hour(s))  Urinalysis, Routine w reflex microscopic (not at [facility])     Status: None   Collection Time: 09/05/15 12:05 PM  Result Value Ref Range   Color, Urine YELLOW YELLOW   APPearance CLEAR CLEAR   Specific Gravity, Urine 1.015 1.005 - 1.030   pH 6.5 5.0 - 8.0   Glucose, UA NEGATIVE NEGATIVE mg/dL   Hgb urine dipstick NEGATIVE NEGATIVE   Bilirubin Urine NEGATIVE NEGATIVE   Ketones, ur NEGATIVE NEGATIVE mg/dL   Protein, ur NEGATIVE NEGATIVE mg/dL   Nitrite NEGATIVE NEGATIVE   Leukocytes, UA NEGATIVE  NEGATIVE  CBC     Status: Abnormal   Collection Time: 09/05/15 12:39 PM  Result Value Ref Range   WBC 4.3 4.0 - 10.5 K/uL   RBC 4.03 3.87 - 5.11 MIL/uL   Hemoglobin 11.5 (L) 12.0 - 15.0 g/dL   HCT 40.9 (L) 81.1 - 91.4 %   MCV 84.4 78.0 - 100.0 fL   MCH 28.5 26.0 - 34.0 pg   MCHC 33.8 30.0 - 36.0 g/dL   RDW 78.2 95.6 - 21.3 %   Platelets 304 150 - 400 K/uL  ABO/Rh     Status: None   Collection Time: 09/05/15 12:39 PM  Result Value Ref Range   ABO/RH(D) B POS   hCG, quantitative, pregnancy     Status: Abnormal   Collection Time: 09/05/15 12:40 PM  Result Value Ref Range   hCG, Beta Chain, Quant, S 41030 (H) <5 mIU/mL  Wet prep, genital     Status: Abnormal   Collection Time: 09/05/15 12:55 PM  Result Value Ref Range   Yeast Wet Prep HPF POC NONE SEEN NONE SEEN   Trich, Wet Prep NONE SEEN NONE SEEN   Clue Cells Wet Prep HPF POC NONE SEEN NONE SEEN   WBC, Wet Prep HPF POC FEW (A) NONE SEEN   Sperm NONE SEEN        IMAGING US Ob Comp Less 14 Wks  09/05/2015  CLINICAL DATA:  Right flank pain for 4 days, early pregnancy. Menstrual dating is 5 weeks 1 day with estimated date of confinement of May 06, 2016 EXAM: OBSTETRIC <14 WK [place] AND TRANSVAGINAL OB US TECHNIQUE: Both transabdominal and transvaginal ultrasound examinations were performed for complete evaluation of the gestation as well as the maternal uterus, adnexal regions, and pelvic cul-de-sac. Transvaginal technique was performed to assess early pregnancy. COMPARISON:  None. FINDINGS: Intrauterine gestational sac: Present common normal in shape. It is located in the right cornu of the endometrial cavity. Yolk sac:  Present Embryo:  Present Cardiac Activity: Present Heart Rate: 114.  Bpm CRL: 0.45 cm mm 6 w 1 d [place] EDC: April 29, 2016. Subchorionic hemorrhage:  None visualized. Maternal uterus/adnexae: Normal. IMPRESSION: Viable IUP with estimated gestational age of [redacted] weeks 1 day and estimated date of confinement of April 29, 2016. The location and of the gestational sac is in the right cornu. There is no subchorionic hemorrhage. The maternal ovaries are unremarkable. Electronically Signed   By: David  [place] M.D.   On: 09/05/2015 14:36   US Ob Transvaginal  09/05/2015  CLINICAL DATA:  Right flank pain for 4 days, early pregnancy. Menstrual dating is 5 weeks 1 day with estimated date of confinement of May 06, 2016 EXAM: OBSTETRIC <14 WK [place] AND TRANSVAGINAL OB US TECHNIQUE: Both transabdominal and transvaginal ultrasound examinations were performed for complete evaluation of the gestation as well as the maternal  uterus, adnexal regions, and pelvic cul-de-sac. Transvaginal technique was performed to assess early pregnancy. COMPARISON:  None. FINDINGS: Intrauterine gestational sac: Present common normal in shape. It is located in the right cornu of the endometrial cavity. Yolk sac:  Present Embryo:  Present Cardiac Activity: Present Heart Rate: 114.  Bpm CRL: 0.45 cm mm 6 w 1 d US EDC: April 29, 2016. Subchorionic hemorrhage:  None visualized. Maternal uterus/adnexae: Normal. IMPRESSION: Viable IUP with estimated gestational age of [redacted] weeks 1 day and estimated date of confinement of April 29, 2016. The location and of the gestational sac is in the right cornu. There is no subchorionic hemorrhage. The maternal ovaries are unremarkable. Electronically Signed   By: David  [place] M.D.   On: 09/05/2015 14:36    MAU Management/MDM: Ordered labs and reviewed results.   Consult Dr Shawnie PonsPratt with presentation, exam findings, and results.   Treatments in MAU included none.   This bleeding could represent a normal pregnancy with bleeding, spontaneous abortion or even an ectopic which can be life-threatening.   Cultures were done to rule out pelvic infection Blood drawn for Quant HCG, CBC, ABO/Rh  Pt stable at time of discharge.  ASSESSMENT Single intrauterine right cornual pregnancy Abdominal pain in pregnancy  PLAN Discharge  home Dr Shawnie PonsPratt explained results to patient Ectopic precautions Return in one week for followup US    Medication List    ASK your doctor about these medications        Doxylamine-Pyridoxine 10-10 MG Tbec  Take 1 tablet by mouth 2 (two) times daily after a meal.     ferrous fumarate 325 (106 Fe) MG Tabs tablet  Commonly known as:  HEMOCYTE - 106 mg FE  Take 1 tablet by mouth daily.     norethindrone-ethinyl estradiol 1-20 MG-MCG tablet  Commonly known as:  JUNEL FE,GILDESS FE,LOESTRIN FE  Take 1 tablet by mouth daily.         Encouraged to return here or to other Urgent Care/ED if she develops worsening of symptoms, increase in pain, fever, or other concerning symptoms.    Wynelle BourgeoisMarie Dearis Danis CNM, MSN Certified Nurse-Midwife 09/05/2015  12:30 PM

## 2015-09-05 NOTE — MAU Note (Signed)
C/o abdominal pain and R flank pain for past week; 5w1 d gestation; denies any vaginal bleeding;

## 2015-09-05 NOTE — Discharge Instructions (Signed)

## 2015-09-05 NOTE — MAU Note (Signed)
Pt C/O lower abd pain for the last 4 days, also R flank pain.  Had pos UPT @ Urgent Care yesterday.  Denies vaginal bleeding.  No appetitie.

## 2015-09-06 LAB — GC/CHLAMYDIA PROBE AMP (~~LOC~~) NOT AT ARMC
Chlamydia: NEGATIVE
NEISSERIA GONORRHEA: NEGATIVE

## 2015-09-06 LAB — HIV ANTIBODY (ROUTINE TESTING W REFLEX): HIV Screen 4th Generation wRfx: NONREACTIVE

## 2015-09-15 ENCOUNTER — Encounter: Payer: Self-pay | Admitting: Obstetrics & Gynecology

## 2015-09-15 ENCOUNTER — Ambulatory Visit (HOSPITAL_COMMUNITY)
Admission: RE | Admit: 2015-09-15 | Discharge: 2015-09-15 | Disposition: A | Discharge status: Home / Self Care | Payer: BLUE CROSS/BLUE SHIELD | Source: Ambulatory Visit | Attending: Advanced Practice Midwife | Admitting: Advanced Practice Midwife

## 2015-09-15 ENCOUNTER — Ambulatory Visit (INDEPENDENT_AMBULATORY_CARE_PROVIDER_SITE_OTHER): Payer: BLUE CROSS/BLUE SHIELD | Admitting: Obstetrics & Gynecology

## 2015-09-15 DIAGNOSIS — O26899 Other specified pregnancy related conditions, unspecified trimester: Secondary | ICD-10-CM

## 2015-09-15 DIAGNOSIS — Z36 Encounter for antenatal screening of mother: Secondary | ICD-10-CM | POA: Insufficient documentation

## 2015-09-15 DIAGNOSIS — O3481 Maternal care for other abnormalities of pelvic organs, first trimester: Secondary | ICD-10-CM | POA: Diagnosis not present

## 2015-09-15 DIAGNOSIS — N8311 Corpus luteum cyst of right ovary: Secondary | ICD-10-CM | POA: Diagnosis not present

## 2015-09-15 DIAGNOSIS — R109 Unspecified abdominal pain: Secondary | ICD-10-CM

## 2015-09-15 DIAGNOSIS — Z3A01 Less than 8 weeks gestation of pregnancy: Secondary | ICD-10-CM | POA: Diagnosis not present

## 2015-09-15 DIAGNOSIS — Z3490 Encounter for supervision of normal pregnancy, unspecified, unspecified trimester: Secondary | ICD-10-CM

## 2015-09-15 DIAGNOSIS — Z3201 Encounter for pregnancy test, result positive: Secondary | ICD-10-CM

## 2015-09-15 MED ORDER — PROMETHAZINE HCL 25 MG PO TABS: 25.0000 mg | ORAL_TABLET | Freq: Four times a day (QID) | ORAL | Status: DC | PRN

## 2015-09-15 MED ORDER — PRENATAL VITAMINS 0.8 MG PO TABS: 1.0000 | ORAL_TABLET | Freq: Every day | ORAL | Status: DC

## 2015-09-15 NOTE — Progress Notes (Signed)
Ultrasounds Results Note  SUBJECTIVE HPI:  Diane [name] is a 25 y.o. G3P0010 at 1676w4d by LMP who [facility] for followup ultrasound results. The patient denies abdominal pain or vaginal bleeding.  Upon review of the patient's records, patient was first seen in MAU on 3/7.  Ultrasound showed 6.1 weeks possible cornual pregnancy. .  Repeat ultrasound was performed earlier today and showed IUP 7.4 weeks  Past Medical History  Diagnosis Date  . Dysmenorrhea    Past Surgical History  Procedure Laterality Date  . Hernia repair     Social History   Social History  . Marital Status: Single    Spouse Name: N/A  . Number of Children: 0  . Years of Education: 15   Occupational History  . Seamer    Social History Main Topics  . Smoking status: Never Smoker   . Smokeless tobacco: Never Used  . Alcohol Use: No  . Drug Use: No     Comment: Discussed birth control options  . Sexual Activity:    Partners: Male    Birth Control/ Protection: OCP     Comment: Loestrin Fe   Other Topics Concern  . Not on file   Social History Narrative   Fun: Dance   Denies religious beliefs effecting health care.    Current Outpatient Prescriptions on File Prior to Visit  Medication Sig Dispense Refill  . Doxylamine-Pyridoxine 10-10 MG TBEC Take 1 tablet by mouth 2 (two) times daily after a meal. (Patient not taking: Reported on 09/05/2015) 60 tablet 1   No current facility-administered medications on file prior to visit.   Allergies  Allergen Reactions  . Coconut Flavor Rash  . Fish Allergy Rash  . Pineapple Rash    I have reviewed patient's Past Medical Hx, Surgical Hx, Family Hx, Social Hx, medications and allergies.   Review of Systems Review of Systems  Constitutional: Negative for fever and chills.  Gastrointestinal: Negative for nausea, vomiting, abdominal pain, diarrhea and constipation.  Genitourinary: Negative for dysuria.  Musculoskeletal:  Negative for back pain.  Neurological: Negative for dizziness and weakness.    Physical Exam  LMP 07/31/2015 (Approximate)  GENERAL: Well-developed, well-nourished female in no acute distress.  HEENT: Normocephalic, atraumatic.   LUNGS: Effort normal ABDOMEN: soft, non-tender HEART: Regular rate  SKIN: Warm, dry and without erythema PSYCH: Normal mood and affect NEURO: Alert and oriented x 4  LAB RESULTS No results found for this or any previous visit (from the past 24 hour(s)).  IMAGING [place] Ob Comp Less 14 Wks  09/05/2015  CLINICAL DATA:  Right flank pain for 4 days, early pregnancy. Menstrual dating is 5 weeks 1 day with estimated date of confinement of May 06, 2016 EXAM: OBSTETRIC <14 WK US AND TRANSVAGINAL OB US TECHNIQUE: Both transabdominal and transvaginal ultrasound examinations were performed for complete evaluation of the gestation as well as the maternal uterus, adnexal regions, and pelvic cul-de-sac. Transvaginal technique was performed to assess early pregnancy. COMPARISON:  None. FINDINGS: Intrauterine gestational sac: Present common normal in shape. It is located in the right cornu of the endometrial cavity. Yolk sac:  Present Embryo:  Present Cardiac Activity: Present Heart Rate: 114.  Bpm CRL: 0.45 cm mm 6 w 1 d US EDC: April 29, 2016. Subchorionic hemorrhage:  None visualized. Maternal uterus/adnexae: Normal. IMPRESSION: Viable IUP with estimated gestational age of [redacted] weeks 1 day and estimated date of confinement of April 29, 2016. The location and of the gestational  sac is in the right cornu. There is no subchorionic hemorrhage. The maternal ovaries are unremarkable. Electronically Signed   By: David  [place] M.D.   On: 09/05/2015 14:36   US Ob Transvaginal  09/15/2015  CLINICAL DATA:  Follow-up possible corneal pregnancy EXAM: TRANSVAGINAL OB ULTRASOUND TECHNIQUE: Transvaginal ultrasound was performed for complete evaluation of the gestation as well as the maternal  uterus, adnexal regions, and pelvic cul-de-sac. COMPARISON:  09/05/2015 FINDINGS: Intrauterine gestational sac: Visualized/normal in shape. This is slightly to the right, but does not appear coronal on today's study Yolk sac:  Visualized Embryo:  Visualized Cardiac Activity: Visualized Heart Rate: 162 bpm MSD:   mm    w     d CRL:   12.9  mm   7 w 4 d                  [place] EDC: 04/29/2016 Subchorionic hemorrhage:  None visualized. Maternal uterus/adnexae: No adnexal masses or free fluid. Right corpus luteum cyst. IMPRESSION: Normal appearing early intrauterine pregnancy. Fetal heart rate 162 beats per minute. No acute maternal findings. Electronically Signed   By: Charlett Nose M.D.   On: 09/15/2015 11:43   US Ob Transvaginal  09/05/2015  CLINICAL DATA:  Right flank pain for 4 days, early pregnancy. Menstrual dating is 5 weeks 1 day with estimated date of confinement of May 06, 2016 EXAM: OBSTETRIC <14 WK [place] AND TRANSVAGINAL OB US TECHNIQUE: Both transabdominal and transvaginal ultrasound examinations were performed for complete evaluation of the gestation as well as the maternal uterus, adnexal regions, and pelvic cul-de-sac. Transvaginal technique was performed to assess early pregnancy. COMPARISON:  None. FINDINGS: Intrauterine gestational sac: Present common normal in shape. It is located in the right cornu of the endometrial cavity. Yolk sac:  Present Embryo:  Present Cardiac Activity: Present Heart Rate: 114.  Bpm CRL: 0.45 cm mm 6 w 1 d [place] EDC: April 29, 2016. Subchorionic hemorrhage:  None visualized. Maternal uterus/adnexae: Normal. IMPRESSION: Viable IUP with estimated gestational age of [redacted] weeks 1 day and estimated date of confinement of April 29, 2016. The location and of the gestational sac is in the right cornu. There is no subchorionic hemorrhage. The maternal ovaries are unremarkable. Electronically Signed   By: David  [place] M.D.   On: 09/05/2015 14:36    ASSESSMENT 1. Intrauterine  pregnancy     PLAN Discharge home in stable condition Patient advised to start/continue taking prenatal vitamins Schedule NOB Fetal NT in 4-5 weeks Pregnancy confirmation letter given Patient advised to start prenatal care with [facility] provider of choice as soon as possible Phenergan for nausea Adam Phenix, MD  09/15/2015  11:53 AM

## 2015-09-15 NOTE — Patient Instructions (Signed)
Prenatal Care °WHAT IS PRENATAL CARE?  °Prenatal care is the process of caring for a pregnant woman before she gives birth. Prenatal care makes sure that she and her baby remain as healthy as possible throughout pregnancy. Prenatal care may be provided by a midwife, family practice health care provider, or a childbirth and pregnancy specialist (obstetrician). Prenatal care may include physical examinations, testing, treatments, and education on nutrition, lifestyle, and social support services. °WHY IS PRENATAL CARE SO IMPORTANT?  °Early and consistent prenatal care increases the chance that you and your baby will remain healthy throughout your pregnancy. This type of care also decreases a baby's risk of being born too early (prematurely), or being born smaller than expected (small for gestational age). Any underlying medical conditions you may have that could pose a risk during your pregnancy are discussed during prenatal care visits. You will also be monitored regularly for any new conditions that may arise during your pregnancy so they can be treated quickly and effectively. °WHAT HAPPENS DURING PRENATAL CARE VISITS? °Prenatal care visits may include the following: °Discussion °Tell your health care provider about any new signs or symptoms you have experienced since your last visit. These might include: °· Nausea or vomiting. °· Increased or decreased level of energy. °· Difficulty sleeping. °· Back or leg pain. °· Weight changes. °· Frequent urination. °· Shortness of breath with physical activity. °· Changes in your skin, such as the development of a rash or itchiness. °· Vaginal discharge or bleeding. °· Feelings of excitement or nervousness. °· Changes in your baby's movements. °You may want to write down any questions or topics you want to discuss with your health care provider and bring them with you to your appointment. °Examination °During your first prenatal care visit, you will likely have a complete  physical exam. Your health care provider will often examine your vagina, cervix, and the position of your uterus, as well as check your heart, lungs, and other body systems. As your pregnancy progresses, your health care provider will measure the size of your uterus and your baby's position inside your uterus. He or she may also examine you for early signs of labor. Your prenatal visits may also include checking your blood pressure and, after about 10-12 weeks of pregnancy, listening to your baby's heartbeat. °Testing °Regular testing often includes: °· Urinalysis. This checks your urine for glucose, protein, or signs of infection. °· Blood count. This checks the levels of white and red blood cells in your body. °· Tests for sexually transmitted infections (STIs). Testing for STIs at the beginning of pregnancy is routinely done and is required in many states. °· Antibody testing. You will be checked to see if you are immune to certain illnesses, such as rubella, that can affect a developing fetus. °· Glucose screen. Around 24-28 weeks of pregnancy, your blood glucose level will be checked for signs of gestational diabetes. Follow-up tests may be recommended. °· Group B strep. This is a bacteria that is commonly found inside a woman's vagina. This test will inform your health care provider if you need an antibiotic to reduce the amount of this bacteria in your body prior to labor and childbirth. °· Ultrasound. Many pregnant women undergo an ultrasound screening around 18-20 weeks of pregnancy to evaluate the health of the fetus and check for any developmental abnormalities. °· HIV (human immunodeficiency virus) testing. Early in your pregnancy, you will be screened for HIV. If you are at high risk for HIV, this test   may be repeated during your third trimester of pregnancy. °You may be offered other testing based on your age, personal or family medical history, or other factors.  °HOW OFTEN SHOULD I PLAN TO SEE MY  HEALTH CARE PROVIDER FOR PRENATAL CARE? °Your prenatal care check-up schedule depends on any medical conditions you have before, or develop during, your pregnancy. If you do not have any underlying medical conditions, you will likely be seen for checkups: °· Monthly, during the first 6 months of pregnancy. °· Twice a month during months 7 and 8 of pregnancy. °· Weekly starting in the 9th month of pregnancy and until delivery. °If you develop signs of early labor or other concerning signs or symptoms, you may need to see your health care provider more often. Ask your health care provider what prenatal care schedule is best for you. °WHAT CAN I DO TO KEEP MYSELF AND MY BABY AS HEALTHY AS POSSIBLE DURING MY PREGNANCY? °· Take a prenatal vitamin containing 400 micrograms (0.4 mg) of folic acid every day. Your health care provider may also ask you to take additional vitamins such as iodine, vitamin D, iron, copper, and zinc. °· Take 1500-2000 mg of calcium daily starting at your 20th week of pregnancy until you deliver your baby. °· Make sure you are up to date on your vaccinations. Unless directed otherwise by your health care provider: °¨ You should receive a tetanus, diphtheria, and pertussis (Tdap) vaccination between the 27th and 36th week of your pregnancy, regardless of when your last Tdap immunization occurred. This helps protect your baby from whooping cough (pertussis) after he or she is born. °¨ You should receive an annual inactivated influenza vaccine (IIV) to help protect you and your baby from influenza. This can be done at any point during your pregnancy. °· Eat a well-rounded diet that includes: °¨ Fresh fruits and vegetables. °¨ Lean proteins. °¨ Calcium-rich foods such as milk, yogurt, hard cheeses, and dark, leafy greens. °¨ Whole grain breads. °· Do not eat seafood high in mercury, including: °¨ Swordfish. °¨ Tilefish. °¨ Shark. °¨ King mackerel. °¨ More than 6 oz tuna per week. °· Do not eat: °¨ Raw  or undercooked meats or eggs. °¨ Unpasteurized foods, such as soft cheeses (brie, blue, or feta), juices, and milks. °¨ Lunch meats. °¨ Hot dogs that have not been heated until they are steaming. °· Drink enough water to keep your urine clear or pale yellow. For many women, this may be 10 or more 8 oz glasses of water each day. Keeping yourself hydrated helps deliver nutrients to your baby and may prevent the start of pre-term uterine contractions. °· Do not use any tobacco products including cigarettes, chewing tobacco, or electronic cigarettes. If you need help quitting, ask your health care provider. °· Do not drink beverages containing alcohol. No safe level of alcohol consumption during pregnancy has been determined. °· Do not use any illegal drugs. These can harm your developing baby or cause a miscarriage. °· Ask your health care provider or pharmacist before taking any prescription or over-the-counter medicines, herbs, or supplements. °· Limit your caffeine intake to no more than 200 mg per day. °· Exercise. Unless told otherwise by your health care provider, try to get 30 minutes of moderate exercise most days of the week. Do not  do high-impact activities, contact sports, or activities with a high risk of falling, such as horseback riding or downhill skiing. °· Get plenty of rest. °· Avoid anything that raises your   body temperature, such as hot tubs and saunas. °· If you own a cat, do not empty its litter box. Bacteria contained in cat feces can cause an infection called toxoplasmosis. This can result in serious harm to the fetus. °· Stay away from chemicals such as insecticides, lead, mercury, and cleaning or paint products that contain solvents. °· Do not have any X-rays taken unless medically necessary. °· Take a childbirth and breastfeeding preparation class. Ask your health care provider if you need a referral or recommendation. °  °This information is not intended to replace advice given to you by  your health care provider. Make sure you discuss any questions you have with your health care provider. °  °Document Released: 06/20/2003 Document Revised: 07/08/2014 Document Reviewed: 09/01/2013 °[employer]vier Interactive Patient Education ©2016 [employer] ° °

## 2015-10-06 ENCOUNTER — Encounter (HOSPITAL_COMMUNITY): Payer: Self-pay | Admitting: Obstetrics & Gynecology

## 2015-10-20 ENCOUNTER — Ambulatory Visit (HOSPITAL_COMMUNITY)
Admission: RE | Admit: 2015-10-20 | Discharge: 2015-10-20 | Disposition: A | Discharge status: Home / Self Care | Payer: BLUE CROSS/BLUE SHIELD | Source: Ambulatory Visit | Attending: Obstetrics & Gynecology | Admitting: Obstetrics & Gynecology

## 2015-10-20 ENCOUNTER — Other Ambulatory Visit: Payer: Self-pay | Admitting: Obstetrics & Gynecology

## 2015-10-20 ENCOUNTER — Encounter (HOSPITAL_COMMUNITY): Payer: Self-pay

## 2015-10-20 DIAGNOSIS — Z369 Encounter for antenatal screening, unspecified: Secondary | ICD-10-CM

## 2015-10-20 DIAGNOSIS — Z3A12 12 weeks gestation of pregnancy: Secondary | ICD-10-CM | POA: Insufficient documentation

## 2015-10-20 DIAGNOSIS — Z36 Encounter for antenatal screening of mother: Secondary | ICD-10-CM | POA: Diagnosis present

## 2015-10-20 DIAGNOSIS — Z3490 Encounter for supervision of normal pregnancy, unspecified, unspecified trimester: Secondary | ICD-10-CM

## 2015-10-21 ENCOUNTER — Inpatient Hospital Stay (HOSPITAL_COMMUNITY)
Admission: AD | Admit: 2015-10-21 | Discharge: 2015-10-21 | Disposition: A | Discharge status: Home / Self Care | Payer: BLUE CROSS/BLUE SHIELD | Source: Ambulatory Visit | Attending: Obstetrics & Gynecology | Admitting: Obstetrics & Gynecology

## 2015-10-21 DIAGNOSIS — O26891 Other specified pregnancy related conditions, first trimester: Secondary | ICD-10-CM | POA: Diagnosis present

## 2015-10-21 DIAGNOSIS — J302 Other seasonal allergic rhinitis: Secondary | ICD-10-CM | POA: Diagnosis not present

## 2015-10-21 DIAGNOSIS — Z3A12 12 weeks gestation of pregnancy: Secondary | ICD-10-CM | POA: Diagnosis not present

## 2015-10-21 NOTE — MAU Provider Note (Signed)
  History     CSN: [id number] Arrival date and time: 10/21/15 0741  First Provider Initiated Contact with Patient 10/21/15 0750      Chief Complaint  Patient presents with  . Allergies   HPI Patient is 25 y.o. G2P0010 [id number] here with complaints of seasoanl allergies. She tried allegra for 10 days but was scared to take other medications due to pregnancy. She reports longstanding history of seasonal allergies. Sx include itchy eyes, nasal congestion, itchy ears. No fevers, no SOB, no cough.    OB History    Gravida Para Term Preterm AB TAB SAB Ectopic Multiple Living   2    1  1    0      Past Medical History  Diagnosis Date  . Dysmenorrhea     Past Surgical History  Procedure Laterality Date  . Hernia repair      Family History  Problem Relation Age of Onset  . Hypertension Mother     Social History  Substance Use Topics  . Smoking status: Never Smoker   . Smokeless tobacco: Never Used  . Alcohol Use: No    Allergies:  Allergies  Allergen Reactions  . Coconut Flavor Rash  . Fish Allergy Rash  . Pineapple Rash    Prescriptions prior to admission  Medication Sig Dispense Refill Last Dose  . Doxylamine-Pyridoxine 10-10 MG TBEC Take 1 tablet by mouth 2 (two) times daily after a meal. (Patient not taking: Reported on 09/05/2015) 60 tablet 1 Not Taking  . Prenatal Multivit-Min-Fe-FA (PRENATAL VITAMINS) 0.8 MG tablet Take 1 tablet by mouth daily. (Patient not taking: Reported on 10/20/2015) 30 tablet 12 Not Taking  . promethazine (PHENERGAN) 25 MG tablet Take 1 tablet (25 mg total) by mouth every 6 (six) hours as needed for nausea or vomiting. (Patient not taking: Reported on 10/20/2015) 30 tablet 2 Not Taking    Review of Systems  Constitutional: Negative for fever and chills.  HENT: Positive for congestion.   Eyes: Positive for redness. Negative for blurred vision and double vision.  Respiratory: Negative for cough and shortness of breath.   Cardiovascular: Negative  for chest pain and orthopnea.  Skin: Positive for itching. Negative for rash.  Neurological: Negative for weakness and headaches.   Physical Exam   Blood pressure 121/71, pulse 94, temperature 98 F (36.7 C), temperature source Oral, resp. rate 18, last menstrual period 07/31/2015.  Physical Exam  Nursing note and vitals reviewed. Constitutional: She is oriented to person, place, and time. She appears well-developed and well-nourished. No distress.  HENT:  Head: Normocephalic and atraumatic.  Nasal congestion   Eyes: Conjunctivae are normal. No scleral icterus.  Erythematous eyes  Neck: Normal range of motion. Neck supple.  Cardiovascular: Normal rate and intact distal pulses.   Respiratory: Effort normal. She exhibits no tenderness.  Musculoskeletal: She exhibits no edema.  Neurological: She is alert and oriented to person, place, and time.  Skin: Skin is warm and dry. No rash noted.  Psychiatric: She has a normal mood and affect.    MAU Course  Procedures  Assessment and Plan  #Seasonal allergies - recommended starting Zytec (daily), Flonase (daily) and benadryl prn - [facility]  Jettson Crable Niles Fenton Candee 10/21/2015, 7:50 AM   Attending Physician: Scheryl DarterJames Arnold

## 2015-10-21 NOTE — Discharge Instructions (Signed)
Safe Medications in Pregnancy   Acne: Benzoyl Peroxide Salicylic Acid  Backache/Headache: Tylenol: 2 regular strength every 4 hours OR              2 Extra strength every 6 hours  Colds/Coughs/Allergies: Benadryl (alcohol free) 25 mg every 6 hours as needed Breath right strips Claritin Cepacol throat lozenges Chloraseptic throat spray Cold-Eeze- up to three times per day Cough drops, alcohol free Flonase (by prescription only) Guaifenesin Mucinex Robitussin DM (plain only, alcohol free) Saline nasal spray/drops Sudafed (pseudoephedrine) & Actifed ** use only after [redacted] weeks gestation and if you do not have high blood pressure Tylenol Vicks Vaporub Zinc lozenges Zyrtec   Constipation: Colace Ducolax suppositories Fleet enema Glycerin suppositories Metamucil Milk of magnesia Miralax Senokot Smooth move tea  Diarrhea: Kaopectate Imodium A-D  *NO pepto Bismol  Hemorrhoids: Anusol Anusol HC Preparation H Tucks  Indigestion: Tums Maalox Mylanta Zantac  Pepcid  Insomnia: Benadryl (alcohol free) 25mg  every 6 hours as needed Tylenol PM Unisom, no Gelcaps  Leg Cramps: Tums MagGel  Nausea/Vomiting:  Bonine Dramamine Emetrol Ginger extract Sea bands Meclizine  Nausea medication to take during pregnancy:  Unisom (doxylamine succinate 25 mg tablets) Take one tablet daily at bedtime. If symptoms are not adequately controlled, the dose can be increased to a maximum recommended dose of two tablets daily (1/2 tablet in the morning, 1/2 tablet mid-afternoon and one at bedtime). Vitamin B6 100mg  tablets. Take one tablet twice a day (up to 200 mg per day).  Skin Rashes: Aveeno products Benadryl cream or 25mg  every 6 hours as needed Calamine Lotion 1% cortisone cream  Yeast infection: Gyne-lotrimin 7 Monistat 7   **If taking multiple medications, please check labels to avoid duplicating the same active ingredients **take medication as directed on  the label ** Do not exceed 4000 mg of tylenol in 24 hours **Do not take medications that contain aspirin or ibuprofen     Allergic Rhinitis Allergic rhinitis is when the mucous membranes in the nose respond to allergens. Allergens are particles in the air that cause your body to have an allergic reaction. This causes you to release allergic antibodies. Through a chain of events, these eventually cause you to release histamine into the blood stream. Although meant to protect the body, it is this release of histamine that causes your discomfort, such as frequent sneezing, congestion, and an itchy, runny nose.  CAUSES Seasonal allergic rhinitis (hay fever) is caused by pollen allergens that may come from grasses, trees, and weeds. Year-round allergic rhinitis (perennial allergic rhinitis) is caused by allergens such as house dust mites, pet dander, and mold spores. SYMPTOMS  Nasal stuffiness (congestion).  Itchy, runny nose with sneezing and tearing of the eyes. DIAGNOSIS Your health care provider can help you determine the allergen or allergens that trigger your symptoms. If you and your health care provider are unable to determine the allergen, skin or blood testing may be used. Your health care provider will diagnose your condition after taking your health history and performing a physical exam. Your health care provider may assess you for other related conditions, such as asthma, pink eye, or an ear infection. TREATMENT Allergic rhinitis does not have a cure, but it can be controlled by:  Medicines that block allergy symptoms. These may include allergy shots, nasal sprays, and oral antihistamines.  Avoiding the allergen. Hay fever may often be treated with antihistamines in pill or nasal spray forms. Antihistamines block the effects of histamine. There are over-the-counter medicines  over-the-counter medicines that may help with nasal congestion and swelling around the eyes. Check with your health care provider  before taking or giving this medicine. If avoiding the allergen or the medicine prescribed do not work, there are many new medicines your health care provider can prescribe. Stronger medicine may be used if initial measures are ineffective. Desensitizing injections can be used if medicine and avoidance does not work. Desensitization is when a patient is given ongoing shots until the body becomes less sensitive to the allergen. Make sure you follow up with your health care provider if problems continue. HOME CARE INSTRUCTIONS It is not possible to completely avoid allergens, but you can reduce your symptoms by taking steps to limit your exposure to them. It helps to know exactly what you are allergic to so that you can avoid your specific triggers. SEEK MEDICAL CARE IF:  You have a fever.  You develop a cough that does not stop easily (persistent).  You have shortness of breath.  You start wheezing.  Symptoms interfere with normal daily activities.   This information is not intended to replace advice given to you by your health care provider. Make sure you discuss any questions you have with your health care provider.   Document Released: 03/12/2001 Document Revised: 07/08/2014 Document Reviewed: 02/22/2013 Elsevier Interactive Patient Education 2016 Elsevier Inc.  

## 2015-10-21 NOTE — MAU Note (Signed)
Patient presents with seasonal allergies took Allegra did not help, has gotten worse.

## 2015-10-30 ENCOUNTER — Encounter (HOSPITAL_COMMUNITY): Payer: Self-pay | Admitting: *Deleted

## 2015-10-30 ENCOUNTER — Inpatient Hospital Stay (HOSPITAL_COMMUNITY)
Admission: AD | Admit: 2015-10-30 | Discharge: 2015-10-30 | Disposition: A | Discharge status: Home / Self Care | Payer: BLUE CROSS/BLUE SHIELD | Source: Ambulatory Visit | Attending: Family Medicine | Admitting: Family Medicine

## 2015-10-30 DIAGNOSIS — O9989 Other specified diseases and conditions complicating pregnancy, childbirth and the puerperium: Secondary | ICD-10-CM

## 2015-10-30 DIAGNOSIS — Z3A14 14 weeks gestation of pregnancy: Secondary | ICD-10-CM | POA: Diagnosis not present

## 2015-10-30 DIAGNOSIS — O26892 Other specified pregnancy related conditions, second trimester: Secondary | ICD-10-CM | POA: Diagnosis not present

## 2015-10-30 DIAGNOSIS — R109 Unspecified abdominal pain: Secondary | ICD-10-CM | POA: Diagnosis present

## 2015-10-30 LAB — URINALYSIS, ROUTINE W REFLEX MICROSCOPIC
BILIRUBIN URINE: NEGATIVE
Glucose, UA: NEGATIVE mg/dL
HGB URINE DIPSTICK: NEGATIVE
KETONES UR: NEGATIVE mg/dL
Leukocytes, UA: NEGATIVE
NITRITE: NEGATIVE
PROTEIN: NEGATIVE mg/dL
Specific Gravity, Urine: <1.005 — ABNORMAL LOW (ref 1.005–1.030)
pH: 5 (ref 5.0–8.0)

## 2015-10-30 MED ORDER — OXYCODONE-ACETAMINOPHEN 5-325 MG PO TABS: 1.0000 | ORAL_TABLET | ORAL | Status: DC | PRN

## 2015-10-30 MED ORDER — OXYCODONE-ACETAMINOPHEN 5-325 MG PO TABS
1.0000 | ORAL_TABLET | Freq: Once | ORAL | Status: AC
  Administered 2015-10-30: 1 via ORAL
  Filled 2015-10-30: qty 1

## 2015-10-30 NOTE — MAU Note (Signed)
Pt c/o right flank pain for the past week, worse while sitting and walking. Denies pain while lying down,  no dysuria,  Vag bleeding or discharge.

## 2015-10-30 NOTE — MAU Provider Note (Signed)
History   G2P0010 @ 14 wks in with right flank pain for a week. Pt states is throbbing pain that started a week ago that comes and goes, was worse Saturday.   CSN: [id number]  Arrival date & time 10/30/15  1346   None     No chief complaint on file.   HPI  Past Medical History  Diagnosis Date  . Dysmenorrhea     Past Surgical History  Procedure Laterality Date  . Hernia repair      Family History  Problem Relation Age of Onset  . Hypertension Mother     Social History  Substance Use Topics  . Smoking status: Never Smoker   . Smokeless tobacco: Never Used  . Alcohol Use: No    OB History    Gravida Para Term Preterm AB TAB SAB Ectopic Multiple Living   2    1  1    0      Review of Systems  Constitutional: Negative.   HENT: Negative.   Eyes: Negative.   Respiratory: Negative.   Cardiovascular: Negative.   Gastrointestinal: Negative.   Endocrine: Negative.   Genitourinary: Positive for flank pain.  Musculoskeletal: Positive for back pain.  Skin: Negative.   Allergic/Immunologic: Negative.   Neurological: Negative.   Hematological: Negative.   Psychiatric/Behavioral: Negative.     Allergies  Coconut flavor; Fish allergy; and Pineapple  Home Medications  No current outpatient prescriptions on file.  LMP 07/31/2015 (Approximate)  Physical Exam  Constitutional: She is oriented to person, place, and time. She appears well-developed and well-nourished.  HENT:  Head: Normocephalic.  Eyes: Pupils are equal, round, and reactive to light.  Neck: Normal range of motion.  Cardiovascular: Normal rate, regular rhythm, normal heart sounds and intact distal pulses.   Pulmonary/Chest: Effort normal and breath sounds normal.  Abdominal: Soft. Bowel sounds are normal.  Genitourinary: Vagina normal and uterus normal.  Musculoskeletal: Normal range of motion.  Neurological: She is alert and oriented to person, place, and time. She has normal reflexes.  Skin: Skin  is warm and dry.  Psychiatric: She has a normal mood and affect. Her behavior is normal. Judgment and thought content normal.    MAU Course  Procedures (including critical care time)  Labs Reviewed  URINALYSIS, ROUTINE W REFLEX MICROSCOPIC (NOT AT [facility]) - Abnormal; Notable for the following:    Specific Gravity, Urine <1.005 (*)    All other components within normal limits   No results found.   No diagnosis found.    MDM  SVE firm/cl/th/high. Urine neg. Will d/c with pain management

## 2015-10-30 NOTE — Discharge Instructions (Signed)
Flank Pain Flank pain refers to pain that is located on the side of the body between the upper abdomen and the back. The pain may occur over a short period of time (acute) or may be long-term or reoccurring (chronic). It may be mild or severe. Flank pain can be caused by many things. CAUSES  Some of the more common causes of flank pain include:  Muscle strains.   Muscle spasms.   A disease of your spine (vertebral disk disease).   A lung infection (pneumonia).   Fluid around your lungs (pulmonary edema).   A kidney infection.   Kidney stones.   A very painful skin rash caused by the chickenpox virus (shingles).   Gallbladder disease.  HOME CARE INSTRUCTIONS  Home care will depend on the cause of your pain. In general,  Rest as directed by your caregiver.  Drink enough fluids to keep your urine clear or pale yellow.  Only take over-the-counter or prescription medicines as directed by your caregiver. Some medicines may help relieve the pain.  Tell your caregiver about any changes in your pain.  Follow up with your caregiver as directed. SEEK IMMEDIATE MEDICAL CARE IF:   Your pain is not controlled with medicine.   You have new or worsening symptoms.  Your pain increases.   You have abdominal pain.   You have shortness of breath.   You have persistent nausea or vomiting.   You have swelling in your abdomen.   You feel faint or pass out.   You have blood in your urine.  You have a fever or persistent symptoms for more than 2-3 days.  You have a fever and your symptoms suddenly get worse. MAKE SURE YOU:   Understand these instructions.  Will watch your condition.  Will get help right away if you are not doing well or get worse.   This information is not intended to replace advice given to you by your health care provider. Make sure you discuss any questions you have with your health care provider.   Document Released: 08/08/2005 Document  Revised: 03/11/2012 Document Reviewed: 01/30/2012 Elsevier Interactive Patient Education 2016 Elsevier Inc.  Flank Pain Flank pain refers to pain that is located on the side of the body between the upper abdomen and the back. The pain may occur over a short period of time (acute) or may be long-term or reoccurring (chronic). It may be mild or severe. Flank pain can be caused by many things. CAUSES  Some of the more common causes of flank pain include:  Muscle strains.   Muscle spasms.   A disease of your spine (vertebral disk disease).   A lung infection (pneumonia).   Fluid around your lungs (pulmonary edema).   A kidney infection.   Kidney stones.   A very painful skin rash caused by the chickenpox virus (shingles).   Gallbladder disease.  HOME CARE INSTRUCTIONS  Home care will depend on the cause of your pain. In general,  Rest as directed by your caregiver.  Drink enough fluids to keep your urine clear or pale yellow.  Only take over-the-counter or prescription medicines as directed by your caregiver. Some medicines may help relieve the pain.  Tell your caregiver about any changes in your pain.  Follow up with your caregiver as directed. SEEK IMMEDIATE MEDICAL CARE IF:   Your pain is not controlled with medicine.   You have new or worsening symptoms.  Your pain increases.   You have abdominal pain.  You have shortness of breath.   You have persistent nausea or vomiting.   You have swelling in your abdomen.   You feel faint or pass out.   You have blood in your urine.  You have a fever or persistent symptoms for more than 2-3 days.  You have a fever and your symptoms suddenly get worse. MAKE SURE YOU:   Understand these instructions.  Will watch your condition.  Will get help right away if you are not doing well or get worse.   This information is not intended to replace advice given to you by your health care provider. Make  sure you discuss any questions you have with your health care provider.   Document Released: 08/08/2005 Document Revised: 03/11/2012 Document Reviewed: 01/30/2012 Elsevier Interactive Patient Education [employer]

## 2015-10-31 ENCOUNTER — Other Ambulatory Visit (HOSPITAL_COMMUNITY): Payer: Self-pay

## 2015-11-10 ENCOUNTER — Ambulatory Visit (INDEPENDENT_AMBULATORY_CARE_PROVIDER_SITE_OTHER): Payer: BLUE CROSS/BLUE SHIELD | Admitting: Obstetrics and Gynecology

## 2015-11-10 ENCOUNTER — Encounter: Payer: Self-pay | Admitting: Obstetrics and Gynecology

## 2015-11-10 VITALS — BP 100/66 | HR 64 | Resp 20 | Ht 68.25 in | Wt 188.8 lb

## 2015-11-10 DIAGNOSIS — Z331 Pregnant state, incidental: Secondary | ICD-10-CM

## 2015-11-10 DIAGNOSIS — Z3490 Encounter for supervision of normal pregnancy, unspecified, unspecified trimester: Secondary | ICD-10-CM

## 2015-11-10 NOTE — Progress Notes (Signed)
Patient ID: [name], female   DOB: June 07, 1991, 25 y.o.   MRN: [id number] GYNECOLOGY  VISIT   HPI: 25 y.o.   Single  African American  female   G2P0010 with Patient's last menstrual period was 07/31/2015 (approximate).   here for pregnancy confirmation.   Patient has been receiving [facility]. Thought she was due for an annual exam here, so she made an appointment.   States she may be relocating in the near future to [place] and then [place].   GYNECOLOGIC HISTORY: Patient's last menstrual period was 07/31/2015 (approximate). Contraception:  none Menopausal hormone therapy:  n/a Last mammogram:  09-16-14 Bil.US follow up; U/S of Breast WG:NFAORt:mild decrease in size of previous bx b9 mass in left br.  Decrease in size of 2 add'l similiar appearing masses in left br.  Mild increase in size of similar appearing mass in Rt. br with 2 add'l smaller, similar appearing masses--compatible with B9 process.  No findings suspicious for malignancy--Rec. screen at age 1:TBC Last pap smear: 11-18-14 Neg        OB History    Gravida Para Term Preterm AB TAB SAB Ectopic Multiple Living   2    1  1    0         Patient Active Problem List   Diagnosis Date Noted  . Umbilical hernia 09/05/2015  . Vaginal discharge 08/01/2014    Past Medical History  Diagnosis Date  . Dysmenorrhea     Past Surgical History  Procedure Laterality Date  . Hernia repair      Current Outpatient Prescriptions  Medication Sig Dispense Refill  . Prenatal Vit-Fe Fumarate-FA (PNV PRENATAL PLUS MULTIVITAMIN) 27-1 MG TABS Take 1 tablet by mouth daily.  1   No current facility-administered medications for this visit.     ALLERGIES: Coconut flavor; Fish allergy; and Pineapple  Family History  Problem Relation Age of Onset  . Hypertension Mother     Social History   Social History  . Marital Status: Single    Spouse Name: N/A  . Number of Children: 0  . Years of  Education: 15   Occupational History  . Seamer    Social History Main Topics  . Smoking status: Not on file  . Smokeless tobacco: Never Used  . Alcohol Use: No  . Drug Use: No     Comment: Discussed birth control options  . Sexual Activity:    Partners: Male    Pharmacist, hospitalBirth Control/ Protection: None   Other Topics Concern  . Not on file   Social History Narrative   Fun: Dance   Denies religious beliefs effecting health care.     ROS:  Pertinent items are noted in HPI.  PHYSICAL EXAMINATION:    BP 100/66 mmHg  Pulse 64  Resp 20  Ht 5' 8.25" (1.734 m)  Wt 188 lb 12.8 oz (85.639 kg)  BMI 28.48 kg/m2  LMP 07/31/2015 (Approximate)    General appearance: alert, cooperative and appears stated age   ASSESSMENT  Pregnancy.    PLAN  Discussed prenatal care and need to continue with OB office.  May return for routine GYN care after 6 week postpartum visit.  Our congratulations!   An After Visit Summary was printed and given to the patient.

## 2015-11-29 ENCOUNTER — Encounter (HOSPITAL_COMMUNITY): Payer: Self-pay | Admitting: *Deleted

## 2015-11-29 ENCOUNTER — Inpatient Hospital Stay (HOSPITAL_COMMUNITY)
Admission: AD | Admit: 2015-11-29 | Discharge: 2015-11-29 | Disposition: A | Discharge status: Home / Self Care | Payer: Medicaid Other | Source: Ambulatory Visit | Attending: Obstetrics & Gynecology | Admitting: Obstetrics & Gynecology

## 2015-11-29 DIAGNOSIS — Z3A18 18 weeks gestation of pregnancy: Secondary | ICD-10-CM | POA: Insufficient documentation

## 2015-11-29 DIAGNOSIS — O26892 Other specified pregnancy related conditions, second trimester: Secondary | ICD-10-CM | POA: Insufficient documentation

## 2015-11-29 DIAGNOSIS — R109 Unspecified abdominal pain: Secondary | ICD-10-CM | POA: Diagnosis not present

## 2015-11-29 DIAGNOSIS — O26899 Other specified pregnancy related conditions, unspecified trimester: Secondary | ICD-10-CM

## 2015-11-29 DIAGNOSIS — O9989 Other specified diseases and conditions complicating pregnancy, childbirth and the puerperium: Secondary | ICD-10-CM | POA: Diagnosis not present

## 2015-11-29 LAB — URINALYSIS, ROUTINE W REFLEX MICROSCOPIC
BILIRUBIN URINE: NEGATIVE
GLUCOSE, UA: NEGATIVE mg/dL
Hgb urine dipstick: NEGATIVE
Ketones, ur: NEGATIVE mg/dL
LEUKOCYTES UA: NEGATIVE
NITRITE: NEGATIVE
PH: 6.5 (ref 5.0–8.0)
Protein, ur: NEGATIVE mg/dL

## 2015-11-29 NOTE — Discharge Instructions (Signed)
Second Trimester of Pregnancy The second trimester is from week 13 through week 28, month 4 through 6. This is often the time in pregnancy that you feel your best. Often times, morning sickness has lessened or quit. You may have more energy, and you may get hungry more often. Your unborn baby (fetus) is growing rapidly. At the end of the sixth month, he or she is about 9 inches long and weighs about 1 pounds. You will likely feel the baby move (quickening) between 18 and 20 weeks of pregnancy. HOME CARE   Avoid all smoking, herbs, and alcohol. Avoid drugs not approved by your doctor.  Do not use any tobacco products, including cigarettes, chewing tobacco, and electronic cigarettes. If you need help quitting, ask your doctor. You may get counseling or other support to help you quit.  Only take medicine as told by your doctor. Some medicines are safe and some are not during pregnancy.  Exercise only as told by your doctor. Stop exercising if you start having cramps.  Eat regular, healthy meals.  Wear a good support bra if your breasts are tender.  Do not use hot tubs, steam rooms, or saunas.  Wear your seat belt when driving.  Avoid raw meat, uncooked cheese, and liter boxes and soil used by cats.  Take your prenatal vitamins.  Take 1500-2000 milligrams of calcium daily starting at the 20th week of pregnancy until you deliver your baby.  Try taking medicine that helps you poop (stool softener) as needed, and if your doctor approves. Eat more fiber by eating fresh fruit, vegetables, and whole grains. Drink enough fluids to keep your pee (urine) clear or pale yellow.  Take warm water baths (sitz baths) to soothe pain or discomfort caused by hemorrhoids. Use hemorrhoid cream if your doctor approves.  If you have puffy, bulging veins (varicose veins), wear support hose. Raise (elevate) your feet for 15 minutes, 3-4 times a day. Limit salt in your diet.  Avoid heavy lifting, wear low heals,  and sit up straight.  Rest with your legs raised if you have leg cramps or low back pain.  Visit your dentist if you have not gone during your pregnancy. Use a soft toothbrush to brush your teeth. Be gentle when you floss.  You can have sex (intercourse) unless your doctor tells you not to.  Go to your doctor visits. GET HELP IF:   You feel dizzy.  You have mild cramps or pressure in your lower belly (abdomen).  You have a nagging pain in your belly area.  You continue to feel sick to your stomach (nauseous), throw up (vomit), or have watery poop (diarrhea).  You have bad smelling fluid coming from your vagina.  You have pain with peeing (urination). GET HELP RIGHT AWAY IF:   You have a fever.  You are leaking fluid from your vagina.  You have spotting or bleeding from your vagina.  You have severe belly cramping or pain.  You lose or gain weight rapidly.  You have trouble catching your breath and have chest pain.  You notice sudden or extreme puffiness (swelling) of your face, hands, ankles, feet, or legs.  You have not felt the baby move in over an hour.  You have severe headaches that do not go away with medicine.  You have vision changes.   This information is not intended to replace advice given to you by your health care provider. Make sure you discuss any questions you have with your  health care provider.   Document Released: 09/11/2009 Document Revised: 07/08/2014 Document Reviewed: 08/18/2012 Elsevier Interactive Patient Education 2016 Elsevier Inc. Abdominal Pain During Pregnancy Belly (abdominal) pain is common during pregnancy. Most of the time, it is not a serious problem. Other times, it can be a sign that something is wrong with the pregnancy. Always tell your doctor if you have belly pain. HOME CARE Monitor your belly pain for any changes. The following actions may help you feel better:  Do not have sex (intercourse) or put anything in your vagina  until you feel better.  Rest until your pain stops.  Drink clear fluids if you feel sick to your stomach (nauseous). Do not eat solid food until you feel better.  Only take medicine as told by your doctor.  Keep all doctor visits as told. GET HELP RIGHT AWAY IF:   You are bleeding, leaking fluid, or pieces of tissue come out of your vagina.  You have more pain or cramping.  You keep throwing up (vomiting).  You have pain when you pee (urinate) or have blood in your pee.  You have a fever.  You do not feel your baby moving as much.  You feel very weak or feel like passing out.  You have trouble breathing, with or without belly pain.  You have a very bad headache and belly pain.  You have fluid leaking from your vagina and belly pain.  You keep having watery poop (diarrhea).  Your belly pain does not go away after resting, or the pain gets worse. MAKE SURE YOU:   Understand these instructions.  Will watch your condition.  Will get help right away if you are not doing well or get worse.   This information is not intended to replace advice given to you by your health care provider. Make sure you discuss any questions you have with your health care provider.   Document Released: 06/05/2009 Document Revised: 02/17/2013 Document Reviewed: 01/14/2013 Elsevier Interactive Patient Education [employer] Inc[employer]

## 2015-11-29 NOTE — MAU Note (Signed)
Denies and bleeding but c/o lower abdominal cramping; desires an ultrasound;

## 2015-11-29 NOTE — MAU Note (Signed)
Pt presents to MAU with complaints of lower abdominal cramping. Denies any vaginal bleeding or LOF 

## 2015-11-29 NOTE — MAU Provider Note (Signed)
History     CSN: [id number]  Arrival date and time: 11/29/15 4696   First Provider Initiated Contact with Patient 11/29/15 1026       Chief Complaint  Patient presents with  . Abdominal Pain   HPI Diane [name] is a 25 y.o. G2P0010 at [redacted]w[redacted]d who presents with abdominal cramping. Symptoms began 3 days ago. Reports menstrual like cramping that is constant. Rates 5/10. Has not treated.  Denies vaginal bleeding, vaginal discharge, LOF, n/v/d, constipation, or dysuria.  Is moving to [place] later this week & will start prenatal care there.   OB History    Gravida Para Term Preterm AB TAB SAB Ectopic Multiple Living   0      Past Medical History  Diagnosis Date  . Dysmenorrhea     Past Surgical History  Procedure Laterality Date  . Hernia repair      Family History  Problem Relation Age of Onset  . Hypertension Mother     Social History  Substance Use Topics  . Smoking status: Never Smoker   . Smokeless tobacco: Never Used  . Alcohol Use: No    Allergies:  Allergies  Allergen Reactions  . Coconut Flavor Rash  . Fish Allergy Rash    "Mackeral" only  . Pineapple Rash    Prescriptions prior to admission  Medication Sig Dispense Refill Last Dose  . Prenatal Vit-Fe Fumarate-FA (PNV PRENATAL PLUS MULTIVITAMIN) 27-1 MG TABS Take 1 tablet by mouth daily.  1     Review of Systems  Constitutional: Negative.   Gastrointestinal: Positive for abdominal pain. Negative for nausea, vomiting, diarrhea and constipation.  Genitourinary: Negative.    Physical Exam   Blood pressure 122/88, pulse 74, temperature 98 F (36.7 C), resp. rate 16, last menstrual period 07/31/2015.  Physical Exam  Nursing note and vitals reviewed. Constitutional: She is oriented to person, place, and time. She appears well-developed and well-nourished. No distress.  HENT:  Head: Normocephalic and atraumatic.  Eyes: Conjunctivae are normal. Right eye exhibits no discharge. Left  eye exhibits no discharge. No scleral icterus.  Neck: Normal range of motion.  Cardiovascular: Normal rate, regular rhythm and normal heart sounds.   No murmur heard. Respiratory: Effort normal and breath sounds normal. No respiratory distress. She has no wheezes.  GI: Soft. She exhibits distension. There is no tenderness.  Neurological: She is alert and oriented to person, place, and time.  Skin: Skin is warm and dry. She is not diaphoretic.  Psychiatric: She has a normal mood and affect. Her behavior is normal. Judgment and thought content normal.   Dilation: Closed Exam by::  Estanislado Spire NP  MAU Course  Procedures Results for orders placed or performed during the hospital encounter of 11/29/15 (from the past 24 hour(s))  Urinalysis, Routine w reflex microscopic (not at [facility])     Status: Abnormal   Collection Time: 11/29/15  9:40 AM  Result Value Ref Range   Color, Urine YELLOW YELLOW   APPearance CLEAR CLEAR   Specific Gravity, Urine <1.005 (L) 1.005 - 1.030   pH 6.5 5.0 - 8.0   Glucose, UA NEGATIVE NEGATIVE mg/dL   Hgb urine dipstick NEGATIVE NEGATIVE   Bilirubin Urine NEGATIVE NEGATIVE   Ketones, ur NEGATIVE NEGATIVE mg/dL   Protein, ur NEGATIVE NEGATIVE mg/dL   Nitrite NEGATIVE NEGATIVE   Leukocytes, UA NEGATIVE NEGATIVE    MDM FHT 140 by doppler Cervix closed U/a normal Pt decline pain  meds  Assessment and Plan  A: 1. Abdominal cramping affecting pregnancy     P: Discharge home Start prenatal care Take tylenol prn pain Discussed reasons to return to MAU  Judeth HornErin Phylliss Strege 11/29/2015, 10:26 AM

## 2016-01-09 ENCOUNTER — Inpatient Hospital Stay (HOSPITAL_COMMUNITY)
Admission: AD | Admit: 2016-01-09 | Discharge: 2016-01-09 | Disposition: A | Discharge status: Home / Self Care | Payer: Medicaid Other | Source: Ambulatory Visit | Attending: Obstetrics & Gynecology | Admitting: Obstetrics & Gynecology

## 2016-01-09 ENCOUNTER — Encounter (HOSPITAL_COMMUNITY): Payer: Self-pay

## 2016-01-09 DIAGNOSIS — N946 Dysmenorrhea, unspecified: Secondary | ICD-10-CM | POA: Diagnosis not present

## 2016-01-09 DIAGNOSIS — Z91018 Allergy to other foods: Secondary | ICD-10-CM | POA: Diagnosis not present

## 2016-01-09 DIAGNOSIS — Z91013 Allergy to seafood: Secondary | ICD-10-CM | POA: Insufficient documentation

## 2016-01-09 DIAGNOSIS — O26892 Other specified pregnancy related conditions, second trimester: Secondary | ICD-10-CM | POA: Diagnosis not present

## 2016-01-09 DIAGNOSIS — O99612 Diseases of the digestive system complicating pregnancy, second trimester: Secondary | ICD-10-CM | POA: Diagnosis present

## 2016-01-09 DIAGNOSIS — R51 Headache: Secondary | ICD-10-CM | POA: Diagnosis not present

## 2016-01-09 DIAGNOSIS — R12 Heartburn: Secondary | ICD-10-CM | POA: Diagnosis not present

## 2016-01-09 DIAGNOSIS — Z3A24 24 weeks gestation of pregnancy: Secondary | ICD-10-CM | POA: Insufficient documentation

## 2016-01-09 MED ORDER — CALCIUM CARBONATE ANTACID 500 MG PO CHEW
1.0000 | CHEWABLE_TABLET | Freq: Once | ORAL | Status: AC
  Administered 2016-01-09: 200 mg via ORAL
  Filled 2016-01-09: qty 1

## 2016-01-09 MED ORDER — CYCLOBENZAPRINE HCL 5 MG PO TABS: 5.0000 mg | ORAL_TABLET | Freq: Three times a day (TID) | ORAL | Status: DC

## 2016-01-09 MED ORDER — CALCIUM CARBONATE ANTACID 500 MG PO CHEW: 1.0000 | CHEWABLE_TABLET | Freq: Every day | ORAL | Status: DC

## 2016-01-09 MED ORDER — CYCLOBENZAPRINE HCL 10 MG PO TABS: 5.0000 mg | ORAL_TABLET | Freq: Three times a day (TID) | ORAL | Status: DC | PRN

## 2016-01-09 MED ORDER — ASPIRIN-ACETAMINOPHEN-CAFFEINE 250-250-65 MG PO TABS: 1.0000 | ORAL_TABLET | Freq: Four times a day (QID) | ORAL | Status: DC | PRN

## 2016-01-09 MED ORDER — BUTALBITAL-ASPIRIN-CAFFEINE 50-325-40 MG PO CAPS: 1.0000 | ORAL_CAPSULE | Freq: Two times a day (BID) | ORAL | Status: DC | PRN

## 2016-01-09 MED ORDER — BUTALBITAL-APAP-CAFFEINE 50-325-40 MG PO TABS: 1.0000 | ORAL_TABLET | Freq: Four times a day (QID) | ORAL | Status: DC | PRN

## 2016-01-09 MED ORDER — B-6 250 MG PO TABS: 1.0000 | ORAL_TABLET | Freq: Once | ORAL | Status: DC

## 2016-01-09 MED ORDER — DOXYLAMINE SUCCINATE (SLEEP) 25 MG PO TABS: 25.0000 mg | ORAL_TABLET | Freq: Every evening | ORAL | Status: DC | PRN

## 2016-01-09 NOTE — Discharge Instructions (Signed)
Second Trimester of Pregnancy The second trimester is from week 13 through week 28, months 4 through 6. The second trimester is often a time when you feel your best. Your body has also adjusted to being pregnant, and you begin to feel better physically. Usually, morning sickness has lessened or quit completely, you may have more energy, and you may have an increase in appetite. The second trimester is also a time when the fetus is growing rapidly. At the end of the sixth month, the fetus is about 9 inches long and weighs about 1 pounds. You will likely begin to feel the baby move (quickening) between 18 and 20 weeks of the pregnancy. BODY CHANGES Your body goes through many changes during pregnancy. The changes vary from woman to woman.   Your weight will continue to increase. You will notice your lower abdomen bulging out.  You may begin to get stretch marks on your hips, abdomen, and breasts.  You may develop headaches that can be relieved by medicines approved by your health care provider.  You may urinate more often because the fetus is pressing on your bladder.  You may develop or continue to have heartburn as a result of your pregnancy.  You may develop constipation because certain hormones are causing the muscles that push waste through your intestines to slow down.  You may develop hemorrhoids or swollen, bulging veins (varicose veins).  You may have back pain because of the weight gain and pregnancy hormones relaxing your joints between the bones in your pelvis and as a result of a shift in weight and the muscles that support your balance.  Your breasts will continue to grow and be tender.  Your gums may bleed and may be sensitive to brushing and flossing.  Dark spots or blotches (chloasma, mask of pregnancy) may develop on your face. This will likely fade after the baby is born.  A dark line from your belly button to the pubic area (linea nigra) may appear. This will likely  fade after the baby is born.  You may have changes in your hair. These can include thickening of your hair, rapid growth, and changes in texture. Some women also have hair loss during or after pregnancy, or hair that feels dry or thin. Your hair will most likely return to normal after your baby is born. WHAT TO EXPECT AT YOUR PRENATAL VISITS During a routine prenatal visit:  You will be weighed to make sure you and the fetus are growing normally.  Your blood pressure will be taken.  Your abdomen will be measured to track your baby's growth.  The fetal heartbeat will be listened to.  Any test results from the previous visit will be discussed. Your health care provider may ask you:  How you are feeling.  If you are feeling the baby move.  If you have had any abnormal symptoms, such as leaking fluid, bleeding, severe headaches, or abdominal cramping.  If you are using any tobacco products, including cigarettes, chewing tobacco, and electronic cigarettes.  If you have any questions. Other tests that may be performed during your second trimester include:  Blood tests that check for:  Low iron levels (anemia).  Gestational diabetes (between 24 and 28 weeks).  Rh antibodies.  Urine tests to check for infections, diabetes, or protein in the urine.  An ultrasound to confirm the proper growth and development of the baby.  An amniocentesis to check for possible genetic problems.  Fetal screens for spina bifida  and Down syndrome.  HIV (human immunodeficiency virus) testing. Routine prenatal testing includes screening for HIV, unless you choose not to have this test. HOME CARE INSTRUCTIONS   Avoid all smoking, herbs, alcohol, and unprescribed drugs. These chemicals affect the formation and growth of the baby.  Do not use any tobacco products, including cigarettes, chewing tobacco, and electronic cigarettes. If you need help quitting, ask your health care provider. You may receive  counseling support and other resources to help you quit.  Follow your health care provider's instructions regarding medicine use. There are medicines that are either safe or unsafe to take during pregnancy.  Exercise only as directed by your health care provider. Experiencing uterine cramps is a good sign to stop exercising.  Continue to eat regular, healthy meals.  Wear a good support bra for breast tenderness.  Do not use hot tubs, steam rooms, or saunas.  Wear your seat belt at all times when driving.  Avoid raw meat, uncooked cheese, cat litter boxes, and soil used by cats. These carry germs that can cause birth defects in the baby.  Take your prenatal vitamins.  Take 1500-2000 mg of calcium daily starting at the 20th week of pregnancy until you deliver your baby.  Try taking a stool softener (if your health care provider approves) if you develop constipation. Eat more high-fiber foods, such as fresh vegetables or fruit and whole grains. Drink plenty of fluids to keep your urine clear or pale yellow.  Take warm sitz baths to soothe any pain or discomfort caused by hemorrhoids. Use hemorrhoid cream if your health care provider approves.  If you develop varicose veins, wear support hose. Elevate your feet for 15 minutes, 3-4 times a day. Limit salt in your diet.  Avoid heavy lifting, wear low heel shoes, and practice good posture.  Rest with your legs elevated if you have leg cramps or low back pain.  Visit your dentist if you have not gone yet during your pregnancy. Use a soft toothbrush to brush your teeth and be gentle when you floss.  A sexual relationship may be continued unless your health care provider directs you otherwise.  Continue to go to all your prenatal visits as directed by your health care provider. SEEK MEDICAL CARE IF:   You have dizziness.  You have mild pelvic cramps, pelvic pressure, or nagging pain in the abdominal area.  You have persistent nausea,  vomiting, or diarrhea.  You have a bad smelling vaginal discharge.  You have pain with urination. SEEK IMMEDIATE MEDICAL CARE IF:   You have a fever.  You are leaking fluid from your vagina.  You have spotting or bleeding from your vagina.  You have severe abdominal cramping or pain.  You have rapid weight gain or loss.  You have shortness of breath with chest pain.  You notice sudden or extreme swelling of your face, hands, ankles, feet, or legs.  You have not felt your baby move in over an hour.  You have severe headaches that do not go away with medicine.  You have vision changes.   This information is not intended to replace advice given to you by your health care provider. Make sure you discuss any questions you have with your health care provider.   Document Released: 06/11/2001 Document Revised: 07/08/2014 Document Reviewed: 08/18/2012 Elsevier Interactive Patient Education 2016 Else[employer] Heartburn During Pregnancy Heartburn is a burning sensation in the chest caused by stomach acid backing up into the esophagus. Heartburn is  is common in pregnancy because a certain hormone (progesterone) is released when a woman is pregnant. The progesterone hormone may relax the valve that separates the esophagus from the stomach. This allows acid to go up into the esophagus, causing heartburn. Heartburn may also happen in pregnancy because the enlarging uterus pushes up on the stomach, which pushes more acid into the esophagus. This is especially true in the later stages of pregnancy. Heartburn problems usually go away after giving birth. CAUSES  Heartburn is caused by stomach acid backing up into the esophagus. During pregnancy, this may result from various things, including:   The progesterone hormone.  Changing hormone levels.  The growing uterus pushing stomach acid upward.  Large meals.  Certain foods and drinks.  Exercise.  Increased acid production. SIGNS AND  SYMPTOMS   Burning pain in the chest or lower throat.  Bitter taste in the mouth.  Coughing. DIAGNOSIS  Your health care provider will typically diagnose heartburn by taking a careful history of your concern. Blood tests may be done to check for a certain type of bacteria that is associated with heartburn. Sometimes, heartburn is diagnosed by prescribing a heartburn medicine to see if the symptoms improve. In some cases, a procedure called an endoscopy may be done. In this procedure, a tube with a light and a camera on the end (endoscope) is used to examine the esophagus and the stomach. TREATMENT  Treatment will vary depending on the severity of your symptoms. Your health care provider may recommend:  Over-the-counter medicines (antacids, acid reducers) for mild heartburn.  Prescription medicines to decrease stomach acid or to protect your stomach lining.  Certain changes in your diet.  Elevating the head of your bed by putting blocks under the legs. This helps prevent stomach acid from backing up into the esophagus when you are lying down. HOME CARE INSTRUCTIONS   Only take over-the-counter or prescription medicines as directed by your health care provider.  Raise the head of your bed by putting blocks under the legs if instructed to do so by your health care provider. Sleeping with more pillows is not effective because it only changes the position of your head.  Do not exercise right after eating.  Avoid eating 2-3 hours before bed. Do not lie down right after eating.  Eat small meals throughout the day instead of three large meals.  Identify foods and beverages that make your symptoms worse and avoid them. Foods you may want to avoid include:  Peppers.  Chocolate.  High-fat foods, including fried foods.  Spicy foods.  Garlic and onions.  Citrus fruits, including oranges, grapefruit, lemons, and limes.  Food containing tomatoes or tomato  products.  Mint.  Carbonated and caffeinated drinks.  Vinegar. SEEK MEDICAL CARE IF:  You have abdominal pain of any kind.  You feel burning in your upper abdomen or chest, especially after eating or lying down.  You have nausea and vomiting.  Your stomach feels upset after you eat. SEEK IMMEDIATE MEDICAL CARE IF:   You have severe chest pain that goes down your arm or into your jaw or neck.  You feel sweaty, dizzy, or light-headed.  You become short of breath.  You vomit blood.  You have difficulty or pain with swallowing.  You have bloody or black, tarry stools.  You have episodes of heartburn more than 3 times a week, for more than 2 weeks. MAKE SURE YOU:  Understand these instructions.  Will watch your condition.  Will get   help right away if you are not doing well or get worse.   This information is not intended to replace advice given to you by your health care provider. Make sure you discuss any questions you have with your health care provider.   Document Released: 06/14/2000 Document Revised: 07/08/2014 Document Reviewed: 02/03/2013 Elsevier Interactive Patient Education 2016 Elsevier Inc.  

## 2016-01-09 NOTE — MAU Note (Signed)
Had a bad headache for a wk, was taking Tylenol. Not headache.  Right side of neck is hurting. Has been having problems with heartburn, can't eat.

## 2016-01-09 NOTE — MAU Note (Signed)
Urine sent to lab 

## 2016-01-09 NOTE — MAU Provider Note (Signed)
MAU HISTORY AND PHYSICAL  Chief Complaint:  Headache and Heartburn   Diane [name] is a 25 y.o.  G2P0010 with IUP at 3262w1d presenting for Headache and Heartburn .HA began 1 week ago, on R occiput and neck, intermittent, 8/10, tried tylenol without relief. Denies scotoma. Endorses Hx of headaches similar to this many years ago. Also concerned with heartburn that has worsened over the past week. Tried nothing. Patient states she has been having  none contractions, none vaginal bleeding, intact membranes, with active fetal movement (just started feeling movement last week).  Past Medical History  Diagnosis Date  . Dysmenorrhea     Past Surgical History  Procedure Laterality Date  . Hernia repair      Family History  Problem Relation Age of Onset  . Hypertension Mother     Social History  Substance Use Topics  . Smoking status: Never Smoker   . Smokeless tobacco: Never Used  . Alcohol Use: No    Allergies  Allergen Reactions  . Coconut Flavor Rash  . Fish Allergy Rash    "Mackeral" only  . Pineapple Rash    Prescriptions prior to admission  Medication Sig Dispense Refill Last Dose  . acetaminophen (TYLENOL) 650 MG CR tablet Take 1,300 mg by mouth every 8 (eight) hours as needed for pain.   Past Week at Unknown time    Review of Systems - Negative except for what is mentioned in HPI.  Physical Exam  Blood pressure 111/69, pulse 79, temperature 98.2 F (36.8 C), temperature source Oral, resp. rate 18, weight 89.086 kg (196 lb 6.4 oz), last menstrual period 07/31/2015. GENERAL: Well-developed, well-nourished female in no acute distress.  HEENT: Minimal pain with EOM. Visual fields intact. Pain reproducible on palpation of R neck and trapezius.  LUNGS: Clear to auscultation bilaterally.  HEART: Regular rate and rhythm. ABDOMEN: Soft, nontender, nondistended, gravid.  EXTREMITIES: Nontender, no edema, 2+ distal pulses. FHT:  Cat 1 Contractions: infrequent    Labs: No results found for this or any previous visit (from the past 24 hour(s)).  Imaging Studies:  No results found.  Assessment: Diane [name] is  25 y.o. G2P0010 at 1862w1d presents with Headache and Heartburn .  Plan: Headache - BP reassuring. Reproducible on palpation, likely musculoskeletal. -excedrin -flexeril 5mg  TID prn -tums for GERD -dispo to home, f/u w clinic as scheduled  Loni MuseKate Timberlake 7/11/201710:52 AM  OB FELLOW MAU DISCHARGE ATTESTATION  I have seen and examined this patient; I agree with above documentation in the resident's note. No red flag symptoms for ha, which appears to be mild, and bp wnl. Will treat ha, gerd, and n/v pregnancy. Ob f/u scheduled for next week.   Silvano BilisNoah B Keonia Pasko, MD 3:56 PM

## 2016-01-09 NOTE — Progress Notes (Signed)
Pt initially told charge nurse she had diarrhea today. Denies having any BM today at this time. Pt not placed on precautions.

## 2016-01-18 ENCOUNTER — Encounter: Payer: Self-pay | Admitting: Obstetrics & Gynecology

## 2016-01-18 ENCOUNTER — Ambulatory Visit (INDEPENDENT_AMBULATORY_CARE_PROVIDER_SITE_OTHER): Payer: Medicaid Other | Admitting: Obstetrics & Gynecology

## 2016-01-18 VITALS — BP 109/68 | HR 82 | Wt 198.1 lb

## 2016-01-18 DIAGNOSIS — Z201 Contact with and (suspected) exposure to tuberculosis: Secondary | ICD-10-CM | POA: Diagnosis not present

## 2016-01-18 DIAGNOSIS — Z3402 Encounter for supervision of normal first pregnancy, second trimester: Secondary | ICD-10-CM

## 2016-01-18 DIAGNOSIS — Z124 Encounter for screening for malignant neoplasm of cervix: Secondary | ICD-10-CM | POA: Diagnosis not present

## 2016-01-18 DIAGNOSIS — Z3482 Encounter for supervision of other normal pregnancy, second trimester: Secondary | ICD-10-CM | POA: Diagnosis not present

## 2016-01-18 DIAGNOSIS — Z3400 Encounter for supervision of normal first pregnancy, unspecified trimester: Secondary | ICD-10-CM | POA: Insufficient documentation

## 2016-01-18 DIAGNOSIS — Z3492 Encounter for supervision of normal pregnancy, unspecified, second trimester: Secondary | ICD-10-CM

## 2016-01-18 LAB — POCT URINALYSIS DIP (DEVICE)
Bilirubin Urine: NEGATIVE
Glucose, UA: NEGATIVE mg/dL
Ketones, ur: NEGATIVE mg/dL
LEUKOCYTES UA: NEGATIVE
NITRITE: NEGATIVE
PH: 6 (ref 5.0–8.0)
PROTEIN: NEGATIVE mg/dL
SPECIFIC GRAVITY, URINE: 1.015 (ref 1.005–1.030)
UROBILINOGEN UA: 0.2 mg/dL (ref 0.0–1.0)

## 2016-01-18 NOTE — Progress Notes (Signed)
Here for initial prenatal visit. Given new pregnancy information. 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  Medicaid home form completed.

## 2016-01-18 NOTE — Patient Instructions (Signed)

## 2016-01-18 NOTE — Progress Notes (Signed)
   [facility]    Diane [name] is a G2P0010 451w3d being seen today for her first obstetrical visit.  Her obstetrical history is significant for late to care. Patient does intend to breast feed. Pregnancy history fully reviewed.  Patient reports no complaints.  Filed Vitals:   01/18/16 1338  BP: 109/68  Pulse: 82  Weight: 198 lb 1.6 oz (89.858 kg)    HISTORY: OB History  Gravida Para Term Preterm AB SAB TAB Ectopic Multiple Living  2    1 1     0    # Outcome Date GA Lbr Len/2nd Weight Sex Delivery Anes PTL Lv  2 Current           1 SAB 2014             Past Medical History  Diagnosis Date  . Dysmenorrhea    Past Surgical History  Procedure Laterality Date  . Hernia repair  2011    umbilical    Family History  Problem Relation Age of Onset  . Hypertension Mother      Exam    Uterus:     Pelvic Exam:    Perineum: No Hemorrhoids   Vulva: normal   Vagina:  normal mucosa   pH:     Cervix: no lesions   Adnexa: not evaluated   Bony Pelvis: average  System: Breast:  normal appearance, no masses or tenderness   Skin: normal coloration and turgor, no rashes    Neurologic: oriented, normal mood   Extremities: normal strength, tone, and muscle mass   HEENT sclera clear, anicteric   Mouth/Teeth One large carie   Neck supple   Cardiovascular: regular rate and rhythm   Respiratory:  appears well, vitals normal, no respiratory distress, acyanotic, normal RR, neck free of mass or lymphadenopathy, chest clear, no wheezing, crepitations, rhonchi, normal symmetric air entry   Abdomen: 25 week fundus   Urinary: urethral meatus normal      Assessment:    Pregnancy: G2P0010 Patient Active Problem List   Diagnosis Date Noted  . 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 Supervision of low-risk first pregnancy 01/18/2016  . Umbilical hernia 09/05/2015  . Vaginal discharge 08/01/2014        Plan:     Initial labs drawn. Prenatal vitamins. Problem list reviewed  and updated. Genetic Screening discussed First Screen: results reviewed.  Ultrasound discussed; fetal survey: ordered.  Follow up in 2 weeks. 50% of 30 min visit spent on counseling and coordination of care.     Shelba Susi 01/18/2016

## 2016-01-19 LAB — PRENATAL PROFILE (SOLSTAS)
Antibody Screen: NEGATIVE
Basophils Absolute: 0 cells/uL (ref 0–200)
Basophils Relative: 0 %
Eosinophils Absolute: 72 cells/uL (ref 15–500)
Eosinophils Relative: 1 %
HEMATOCRIT: 33 % — AB (ref 35.0–45.0)
HEP B S AG: NEGATIVE
HIV: NONREACTIVE
Hemoglobin: 11.3 g/dL — ABNORMAL LOW (ref 11.7–15.5)
LYMPHS ABS: 1440 {cells}/uL (ref 850–3900)
Lymphocytes Relative: 20 %
MCH: 31.2 pg (ref 27.0–33.0)
MCHC: 34.2 g/dL (ref 32.0–36.0)
MCV: 91.2 fL (ref 80.0–100.0)
MONO ABS: 720 {cells}/uL (ref 200–950)
MPV: 10.2 fL (ref 7.5–12.5)
Monocytes Relative: 10 %
NEUTROS ABS: 4968 {cells}/uL (ref 1500–7800)
NEUTROS PCT: 69 %
Platelets: 247 10*3/uL (ref 140–400)
RBC: 3.62 MIL/uL — AB (ref 3.80–5.10)
RDW: 14.7 % (ref 11.0–15.0)
Rh Type: POSITIVE
Rubella: 13.1 Index — ABNORMAL HIGH (ref <0.90)
WBC: 7.2 10*3/uL (ref 3.8–10.8)

## 2016-01-20 LAB — CULTURE, OB URINE

## 2016-01-22 ENCOUNTER — Other Ambulatory Visit: Payer: Self-pay | Admitting: Obstetrics & Gynecology

## 2016-01-22 ENCOUNTER — Ambulatory Visit (HOSPITAL_COMMUNITY)
Admission: RE | Admit: 2016-01-22 | Discharge: 2016-01-22 | Disposition: A | Discharge status: Home / Self Care | Payer: Medicaid Other | Source: Ambulatory Visit | Attending: Obstetrics & Gynecology | Admitting: Obstetrics & Gynecology

## 2016-01-22 DIAGNOSIS — Z3A26 26 weeks gestation of pregnancy: Secondary | ICD-10-CM | POA: Insufficient documentation

## 2016-01-22 DIAGNOSIS — Z36 Encounter for antenatal screening of mother: Secondary | ICD-10-CM | POA: Insufficient documentation

## 2016-01-22 DIAGNOSIS — Z1389 Encounter for screening for other disorder: Secondary | ICD-10-CM

## 2016-01-22 DIAGNOSIS — Z3492 Encounter for supervision of normal pregnancy, unspecified, second trimester: Secondary | ICD-10-CM

## 2016-01-23 LAB — HEMOGLOBINOPATHY EVALUATION
HCT: 33 % — ABNORMAL LOW (ref 35.0–45.0)
Hemoglobin: 11.3 g/dL — ABNORMAL LOW (ref 11.7–15.5)
Hgb A2 Quant: 2.8 % (ref 1.8–3.5)
Hgb A: 96.2 %
Hgb F Quant: <1 %
MCH: 31.2 pg (ref 27.0–33.0)
MCV: 91.2 fL (ref 80.0–100.0)
RBC: 3.62 MIL/uL — ABNORMAL LOW (ref 3.80–5.10)
RDW: 14.7 % (ref 11.0–15.0)

## 2016-01-27 LAB — PAIN MGMT, PROFILE 6 CONF W/O MM, U
6 ACETYLMORPHINE: NEGATIVE ng/mL (ref <10)
AMPHETAMINES: NEGATIVE ng/mL (ref <500)
Alcohol Metabolites: NEGATIVE ng/mL (ref <500)
BARBITURATES: NEGATIVE ng/mL (ref <300)
Benzodiazepines: NEGATIVE ng/mL (ref <100)
COCAINE METABOLITE: NEGATIVE ng/mL (ref <150)
Creatinine: 75.3 mg/dL (ref >=20.0)
Marijuana Metabolite: NEGATIVE ng/mL (ref <20)
Methadone Metabolite: NEGATIVE ng/mL (ref <100)
OPIATES: NEGATIVE ng/mL (ref <100)
OXIDANT: NEGATIVE ug/mL (ref <200)
OXYCODONE: NEGATIVE ng/mL (ref <100)
Phencyclidine: NEGATIVE ng/mL (ref <25)
Please note:: 0
pH: 7.47 (ref 4.5–9.0)

## 2016-02-22 ENCOUNTER — Ambulatory Visit (INDEPENDENT_AMBULATORY_CARE_PROVIDER_SITE_OTHER): Payer: Medicaid Other | Admitting: Advanced Practice Midwife

## 2016-02-22 VITALS — BP 120/87 | HR 87 | Wt 207.1 lb

## 2016-02-22 DIAGNOSIS — Z3493 Encounter for supervision of normal pregnancy, unspecified, third trimester: Secondary | ICD-10-CM

## 2016-02-22 DIAGNOSIS — Z3403 Encounter for supervision of normal first pregnancy, third trimester: Secondary | ICD-10-CM

## 2016-02-22 DIAGNOSIS — Z23 Encounter for immunization: Secondary | ICD-10-CM

## 2016-02-22 LAB — POCT URINALYSIS DIP (DEVICE)
Bilirubin Urine: NEGATIVE
GLUCOSE, UA: NEGATIVE mg/dL
Ketones, ur: NEGATIVE mg/dL
LEUKOCYTES UA: NEGATIVE
NITRITE: NEGATIVE
Protein, ur: NEGATIVE mg/dL
Specific Gravity, Urine: 1.015 (ref 1.005–1.030)
UROBILINOGEN UA: 0.2 mg/dL (ref 0.0–1.0)
pH: 5.5 (ref 5.0–8.0)

## 2016-02-22 NOTE — Progress Notes (Signed)
Patient ID: Diane [name], female   DOB: 21-Feb-1991, 25 y.o.   MRN: [id number]   Subjective:  Diane [name] is a 25 y.o. G2P0010 at 2937w3d being seen today for ongoing prenatal care.  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  Patient reports backache, headache, no bleeding and no contractions.  Contractions: Not present.  .  Movement: Present. Denies leaking of fluid.   The following portions of the patient's history were reviewed and updated as appropriate: allergies, current medications, past family history, past medical history, past social history, past surgical history and problem list. Problem list updated.  Objective:   Vitals:   02/22/16 1411  BP: 120/87  Pulse: 87  Weight: 207 lb 1.6 oz (93.9 kg)    Fetal Status: Fetal Heart Rate (bpm): 145 Fundal Height: 30 cm Movement: Present     General:  Alert, oriented and cooperative. Patient is in no acute distress.  Skin: Skin is warm and dry. No rash noted.   Cardiovascular: Normal heart rate noted  Respiratory: Normal respiratory effort, no problems with respiration noted  Abdomen: Soft, gravid, appropriate for gestational age. Pain/Pressure: Present     Pelvic:  Cervical exam deferred        Extremities: Normal range of motion.     Mental Status: Normal mood and affect. Normal behavior. Normal judgment and thought content.   Urinalysis: Urine Protein: Negative Urine Glucose: Negative  Assessment and Plan:  Pregnancy: G2P0010 at 5037w3d 1. Supervision of normal pregnancy, third trimester  Patient was informed on Tdap vaccination importance during pregnancy and consented to receiving the vaccine during today's visit.  Vaccine was given   Term labor symptoms and general obstetric precautions including but not limited to vaginal bleeding, contractions, leaking of  fluid and fetal movement were reviewed in detail with the patient. Please refer to After Visit Summary for other counseling recommendations.  Return in 2 weeks (on 03/07/2016).  Follow up in 2 weeks  Merril Abbeichard K Windi Toro, Student-PA

## 2016-02-22 NOTE — Patient Instructions (Signed)
Tdap Vaccine (Tetanus, Diphtheria and Pertussis): What You Need to Know 1. Why get vaccinated? Tetanus, diphtheria and pertussis are very serious diseases. Tdap vaccine can protect us from these diseases. And, Tdap vaccine given to pregnant women can protect newborn babies against pertussis. TETANUS (Lockjaw) is rare in the [place] today. It causes painful muscle tightening and stiffness, usually all over the body.  It can lead to tightening of muscles in the head and neck so you can't open your mouth, swallow, or sometimes even breathe. Tetanus kills about 1 out of 10 people who are infected even after receiving the best medical care. DIPHTHERIA is also rare in the [place] today. It can cause a thick coating to form in the back of the throat.  It can lead to breathing problems, heart failure, paralysis, and death. PERTUSSIS (Whooping Cough) causes severe coughing spells, which can cause difficulty breathing, vomiting and disturbed sleep.  It can also lead to weight loss, incontinence, and rib fractures. Up to 2 in 100 adolescents and 5 in 100 adults with pertussis are hospitalized or have complications, which could include pneumonia or death. These diseases are caused by bacteria. Diphtheria and pertussis are spread from person to person through secretions from coughing or sneezing. Tetanus enters the body through cuts, scratches, or wounds. Before vaccines, as many as 200,000 cases of diphtheria, 200,000 cases of pertussis, and hundreds of cases of tetanus, were reported in the [place] each year. Since vaccination began, reports of cases for tetanus and diphtheria have dropped by about 99% and for pertussis by about 80%. 2. Tdap vaccine Tdap vaccine can protect adolescents and adults from tetanus, diphtheria, and pertussis. One dose of Tdap is routinely given at age 11 or 12. People who did not get Tdap at that age should get it as soon as possible. Tdap is especially important  for healthcare professionals and anyone having close contact with a baby younger than 12 months. Pregnant women should get a dose of Tdap during every pregnancy, to protect the newborn from pertussis. Infants are most at risk for severe, life-threatening complications from pertussis. Another vaccine, called Td, protects against tetanus and diphtheria, but not pertussis. A Td booster should be given every 10 years. Tdap may be given as one of these boosters if you have never gotten Tdap before. Tdap may also be given after a severe cut or burn to prevent tetanus infection. Your doctor or the person giving you the vaccine can give you more information. Tdap may safely be given at the same time as other vaccines. 3. Some people should not get this vaccine  A person who has ever had a life-threatening allergic reaction after a previous dose of any diphtheria, tetanus or pertussis containing vaccine, OR has a severe allergy to any part of this vaccine, should not get Tdap vaccine. Tell the person giving the vaccine about any severe allergies.  Anyone who had coma or long repeated seizures within 7 days after a childhood dose of DTP or DTaP, or a previous dose of Tdap, should not get Tdap, unless a cause other than the vaccine was found. They can still get Td.  Talk to your doctor if you:  have seizures or another nervous system problem,  had severe pain or swelling after any vaccine containing diphtheria, tetanus or pertussis,  ever had a condition called Guillain-Barr Syndrome (GBS),  aren't feeling well on the day the shot is scheduled. 4. Risks With any medicine, including vaccines, there is   a chance of side effects. These are usually mild and go away on their own. Serious reactions are also possible but are rare. Most people who get Tdap vaccine do not have any problems with it. Mild problems following Tdap (Did not interfere with activities)  Pain where the shot was given (about 3 in 4  adolescents or 2 in 3 adults)  Redness or swelling where the shot was given (about 1 person in 5)  Mild fever of at least 100.4F (up to about 1 in 25 adolescents or 1 in 100 adults)  Headache (about 3 or 4 people in 10)  Tiredness (about 1 person in 3 or 4)  Nausea, vomiting, diarrhea, stomach ache (up to 1 in 4 adolescents or 1 in 10 adults)  Chills, sore joints (about 1 person in 10)  Body aches (about 1 person in 3 or 4)  Rash, swollen glands (uncommon) Moderate problems following Tdap (Interfered with activities, but did not require medical attention)  Pain where the shot was given (up to 1 in 5 or 6)  Redness or swelling where the shot was given (up to about 1 in 16 adolescents or 1 in 12 adults)  Fever over 102F (about 1 in 100 adolescents or 1 in 250 adults)  Headache (about 1 in 7 adolescents or 1 in 10 adults)  Nausea, vomiting, diarrhea, stomach ache (up to 1 or 3 people in 100)  Swelling of the entire arm where the shot was given (up to about 1 in 500). Severe problems following Tdap (Unable to perform usual activities; required medical attention)  Swelling, severe pain, bleeding and redness in the arm where the shot was given (rare). Problems that could happen after any vaccine:  People sometimes faint after a medical procedure, including vaccination. Sitting or lying down for about 15 minutes can help prevent fainting, and injuries caused by a fall. Tell your doctor if you feel dizzy, or have vision changes or ringing in the ears.  Some people get severe pain in the shoulder and have difficulty moving the arm where a shot was given. This happens very rarely.  Any medication can cause a severe allergic reaction. Such reactions from a vaccine are very rare, estimated at fewer than 1 in a million doses, and would happen within a few minutes to a few hours after the vaccination. As with any medicine, there is a very remote chance of a vaccine causing a serious  injury or death. The safety of vaccines is always being monitored. For more information, visit: www.cdc.gov/vaccinesafety/ 5. What if there is a serious problem? What should I look for?  Look for anything that concerns you, such as signs of a severe allergic reaction, very high fever, or unusual behavior.  Signs of a severe allergic reaction can include hives, swelling of the face and throat, difficulty breathing, a fast heartbeat, dizziness, and weakness. These would usually start a few minutes to a few hours after the vaccination. What should I do?  If you think it is a severe allergic reaction or other emergency that can't wait, call 9-1-1 or get the person to the nearest hospital. Otherwise, call your doctor.  Afterward, the reaction should be reported to the Vaccine Adverse Event Reporting System (VAERS). Your doctor might file this report, or you can do it yourself through the VAERS web site at www.vaers.hhs.gov, or by calling 1-800-822-7967. VAERS does not give medical advice.  6. The National Vaccine Injury Compensation Program The National Vaccine Injury Compensation Program (  VICP) is a federal program that was created to compensate people who may have been injured by certain vaccines. Persons who believe they may have been injured by a vaccine can learn about the program and about filing a claim by calling 1-3232790013 or visiting the VICP website at SpiritualWord.atwww.hrsa.gov/vaccinecompensation. There is a time limit to file a claim for compensation. 7. How can I learn more?  Ask your doctor. He or she can give you the vaccine package insert or suggest other sources of information.  Call your local or state health department.  Contact the Centers for Disease Control and Prevention (CDC):  Call (306) 602-81841-410-729-7454 (1-800-CDC-INFO) or  Visit CDC's website at PicCapture.uywww.cdc.gov/vaccines CDC Tdap Vaccine VIS (08/24/13)   This information is not intended to replace advice given to you by your health care  provider. Make sure you discuss any questions you have with your health care provider.   Document Released: 12/17/2011 Document Revised: 07/08/2014 Document Reviewed: 09/29/2013 Elsevier Interactive Patient Education 2016 Elsevier Inc.  Ball CorporationBraxton Hicks Contractions Contractions of the uterus can occur throughout pregnancy. Contractions are not always a sign that you are in labor.  WHAT ARE BRAXTON HICKS CONTRACTIONS?  Contractions that occur before labor are called Braxton Hicks contractions, or false labor. Toward the end of pregnancy (32-34 weeks), these contractions can develop more often and may become more forceful. This is not true labor because these contractions do not result in opening (dilatation) and thinning of the cervix. They are sometimes difficult to tell apart from true labor because these contractions can be forceful and people have different pain tolerances. You should not feel embarrassed if you go to the hospital with false labor. Sometimes, the only way to tell if you are in true labor is for your health care provider to look for changes in the cervix. If there are no prenatal problems or other health problems associated with the pregnancy, it is completely safe to be sent home with false labor and await the onset of true labor. HOW CAN YOU TELL THE DIFFERENCE BETWEEN TRUE AND FALSE LABOR? False Labor  The contractions of false labor are usually shorter and not as hard as those of true labor.   The contractions are usually irregular.   The contractions are often felt in the front of the lower abdomen and in the groin.   The contractions may go away when you walk around or change positions while lying down.   The contractions get weaker and are shorter lasting as time goes on.   The contractions do not usually become progressively stronger, regular, and closer together as with true labor.  True Labor  Contractions in true labor last 30-70 seconds, become very  regular, usually become more intense, and increase in frequency.   The contractions do not go away with walking.   The discomfort is usually felt in the top of the uterus and spreads to the lower abdomen and low back.   True labor can be determined by your health care provider with an exam. This will show that the cervix is dilating and getting thinner.  WHAT TO REMEMBER  Keep up with your usual exercises and follow other instructions given by your health care provider.   Take medicines as directed by your health care provider.   Keep your regular prenatal appointments.   Eat and drink lightly if you think you are going into labor.   If Braxton Hicks contractions are making you uncomfortable:   Change your position from lying  down or resting to walking, or from walking to resting.   Sit and rest in a tub of warm water.   Drink 2-3 glasses of water. Dehydration may cause these contractions.   Do slow and deep breathing several times an hour.  WHEN SHOULD I SEEK IMMEDIATE MEDICAL CARE? Seek immediate medical care if:  Your contractions become stronger, more regular, and closer together.   You have fluid leaking or gushing from your vagina.   You have a fever.   You pass blood-tinged mucus.   You have vaginal bleeding.   You have continuous abdominal pain.   You have low back pain that you never had before.   You feel your baby's head pushing down and causing pelvic pressure.   Your baby is not moving as much as it used to.    This information is not intended to replace advice given to you by your health care provider. Make sure you discuss any questions you have with your health care provider.   Document Released: 06/17/2005 Document Revised: 06/22/2013 Document Reviewed: 03/29/2013 Elsevier Interactive Patient Education [employer]

## 2016-02-23 ENCOUNTER — Encounter: Payer: Self-pay | Admitting: *Deleted

## 2016-03-08 ENCOUNTER — Ambulatory Visit (HOSPITAL_COMMUNITY)
Admission: RE | Admit: 2016-03-08 | Discharge: 2016-03-08 | Disposition: A | Discharge status: Home / Self Care | Payer: Medicaid Other | Source: Ambulatory Visit | Attending: Obstetrics and Gynecology | Admitting: Obstetrics and Gynecology

## 2016-03-08 ENCOUNTER — Ambulatory Visit (INDEPENDENT_AMBULATORY_CARE_PROVIDER_SITE_OTHER): Payer: Medicaid Other | Admitting: Obstetrics and Gynecology

## 2016-03-08 VITALS — BP 99/68 | HR 90 | Wt 212.5 lb

## 2016-03-08 DIAGNOSIS — Z23 Encounter for immunization: Secondary | ICD-10-CM | POA: Diagnosis not present

## 2016-03-08 DIAGNOSIS — O9989 Other specified diseases and conditions complicating pregnancy, childbirth and the puerperium: Secondary | ICD-10-CM | POA: Insufficient documentation

## 2016-03-08 DIAGNOSIS — Z3A32 32 weeks gestation of pregnancy: Secondary | ICD-10-CM | POA: Diagnosis not present

## 2016-03-08 DIAGNOSIS — Z201 Contact with and (suspected) exposure to tuberculosis: Secondary | ICD-10-CM | POA: Diagnosis not present

## 2016-03-08 DIAGNOSIS — R7611 Nonspecific reaction to tuberculin skin test without active tuberculosis: Secondary | ICD-10-CM | POA: Insufficient documentation

## 2016-03-08 DIAGNOSIS — Z3403 Encounter for supervision of normal first pregnancy, third trimester: Secondary | ICD-10-CM | POA: Diagnosis present

## 2016-03-08 LAB — POCT URINALYSIS DIP (DEVICE)
Bilirubin Urine: NEGATIVE
GLUCOSE, UA: NEGATIVE mg/dL
KETONES UR: NEGATIVE mg/dL
LEUKOCYTES UA: NEGATIVE
Nitrite: NEGATIVE
Protein, ur: 30 mg/dL — AB
SPECIFIC GRAVITY, URINE: 1.02 (ref 1.005–1.030)
Urobilinogen, UA: 0.2 mg/dL (ref 0.0–1.0)
pH: 6 (ref 5.0–8.0)

## 2016-03-08 LAB — GLUCOSE TOLERANCE, 1 HOUR (50G) W/O FASTING: GLUCOSE, 1 HR, GESTATIONAL: 97 mg/dL (ref <140)

## 2016-03-08 NOTE — Progress Notes (Signed)
Flu vaccine today 

## 2016-03-08 NOTE — Addendum Note (Signed)
Addended by: Garret ReddishBARNES, Letasha Kershaw M on: 03/08/2016 12:07 PM   Modules accepted: Orders
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
## 2016-03-11 ENCOUNTER — Encounter: Payer: Self-pay | Admitting: General Practice

## 2016-03-20 ENCOUNTER — Ambulatory Visit (INDEPENDENT_AMBULATORY_CARE_PROVIDER_SITE_OTHER): Payer: Medicaid Other | Admitting: Family Medicine

## 2016-03-20 ENCOUNTER — Ambulatory Visit (INDEPENDENT_AMBULATORY_CARE_PROVIDER_SITE_OTHER): Payer: Medicaid Other | Admitting: Clinical

## 2016-03-20 DIAGNOSIS — T7431XA Adult psychological abuse, confirmed, initial encounter: Secondary | ICD-10-CM

## 2016-03-20 DIAGNOSIS — Z3403 Encounter for supervision of normal first pregnancy, third trimester: Secondary | ICD-10-CM

## 2016-03-20 LAB — POCT URINALYSIS DIP (DEVICE)
Bilirubin Urine: NEGATIVE
GLUCOSE, UA: NEGATIVE mg/dL
Hgb urine dipstick: NEGATIVE
Ketones, ur: NEGATIVE mg/dL
LEUKOCYTES UA: NEGATIVE
NITRITE: NEGATIVE
Protein, ur: 30 mg/dL — AB
Specific Gravity, Urine: 1.02 (ref 1.005–1.030)
UROBILINOGEN UA: 0.2 mg/dL (ref 0.0–1.0)
pH: 6 (ref 5.0–8.0)

## 2016-03-20 MED ORDER — PROMETHAZINE HCL 12.5 MG PO TABS: 25.0000 mg | ORAL_TABLET | Freq: Four times a day (QID) | ORAL | 2 refills | Status: DC | PRN
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
## 2016-04-04 ENCOUNTER — Other Ambulatory Visit (HOSPITAL_COMMUNITY)
Admission: RE | Admit: 2016-04-04 | Discharge: 2016-04-04 | Disposition: A | Discharge status: Home / Self Care | Payer: Medicaid Other | Source: Ambulatory Visit | Attending: Obstetrics and Gynecology | Admitting: Obstetrics and Gynecology

## 2016-04-04 ENCOUNTER — Ambulatory Visit (INDEPENDENT_AMBULATORY_CARE_PROVIDER_SITE_OTHER): Payer: Medicaid Other | Admitting: Obstetrics and Gynecology

## 2016-04-04 VITALS — BP 127/81 | HR 82 | Wt 220.9 lb

## 2016-04-04 DIAGNOSIS — Z3493 Encounter for supervision of normal pregnancy, unspecified, third trimester: Secondary | ICD-10-CM | POA: Diagnosis not present

## 2016-04-04 DIAGNOSIS — Z113 Encounter for screening for infections with a predominantly sexual mode of transmission: Secondary | ICD-10-CM | POA: Insufficient documentation

## 2016-04-04 NOTE — Progress Notes (Signed)
Prenatal Visit Note Date: 04/04/2016 Clinic: [facility]  Subjective:  Diane [name] is a 25 y.o. G2P0010 at 6628w3d being seen today for ongoing prenatal care.  She is currently monitored for the following issues for this low-risk pregnancy and has Umbilical hernia; Exposure to TB; and Supervision of low-risk first pregnancy on her problem list.  Patient reports no complaints.   Contractions: Not present.  .  Movement: Present. Denies leaking of fluid.   The following portions of the patient's history were reviewed and updated as appropriate: allergies, current medications, past family history, past medical history, past social history, past surgical history and problem list. Problem list updated.  Objective:   Vitals:   04/04/16 1034  BP: 127/81  Pulse: 82  Weight: 220 lb 14.4 oz (100.2 kg)    Fetal Status: Fetal Heart Rate (bpm): 130s Fundal Height: 36 cm Movement: Present  Presentation: Vertex  General:  Alert, oriented and cooperative. Patient is in no acute distress.  Skin: Skin is warm and dry. No rash noted.   Cardiovascular: Normal heart rate noted  Respiratory: Normal respiratory effort, no problems with respiration noted  Abdomen: Soft, gravid, appropriate for gestational age. Pain/Pressure: Present     Pelvic:  Cervical exam deferred        Extremities: Normal range of motion.     Mental Status: Normal mood and affect. Normal behavior. Normal judgment and thought content.   Urinalysis:      Assessment and Plan:  Pregnancy: G2P0010 at 3728w3d  1. Supervision of low-risk pregnancy, third trimester Routine care. Bedside u/s for ?breech and is cephalic, subj normal AFI and good FM - Culture, beta strep (group b only) - GC[facility]Chlamydia probe amp [facility]  Preterm labor symptoms and general obstetric precautions including but not limited to vaginal bleeding, contractions, leaking of fluid and fetal movement were reviewed in detail with  the patient. Please refer to After Visit Summary for other counseling recommendations.  Return in about 1 week (around 04/11/2016).   [place]osedale Bingharlie Alissah Redmon, MD

## 2016-04-06 LAB — OB RESULTS CONSOLE GBS: STREP GROUP B AG: NEGATIVE

## 2016-04-06 LAB — OB RESULTS CONSOLE GC/CHLAMYDIA
CHLAMYDIA, DNA PROBE: NEGATIVE
GC PROBE AMP, GENITAL: NEGATIVE

## 2016-04-06 LAB — CULTURE, BETA STREP (GROUP B ONLY)

## 2016-04-08 LAB — GC/CHLAMYDIA PROBE AMP (~~LOC~~) NOT AT ARMC
Chlamydia: NEGATIVE
Neisseria Gonorrhea: NEGATIVE

## 2016-04-13 ENCOUNTER — Encounter (HOSPITAL_COMMUNITY): Payer: Self-pay | Admitting: *Deleted

## 2016-04-13 ENCOUNTER — Inpatient Hospital Stay (HOSPITAL_COMMUNITY)
Admission: AD | Admit: 2016-04-13 | Discharge: 2016-04-18 | DRG: 775 | Disposition: A | Discharge status: Home / Self Care | Payer: Medicaid Other | Source: Ambulatory Visit | Attending: Family Medicine | Admitting: Family Medicine

## 2016-04-13 DIAGNOSIS — O9962 Diseases of the digestive system complicating childbirth: Secondary | ICD-10-CM | POA: Diagnosis present

## 2016-04-13 DIAGNOSIS — O1494 Unspecified pre-eclampsia, complicating childbirth: Secondary | ICD-10-CM | POA: Diagnosis not present

## 2016-04-13 DIAGNOSIS — O1493 Unspecified pre-eclampsia, third trimester: Secondary | ICD-10-CM

## 2016-04-13 DIAGNOSIS — O26893 Other specified pregnancy related conditions, third trimester: Secondary | ICD-10-CM

## 2016-04-13 DIAGNOSIS — Z3A37 37 weeks gestation of pregnancy: Secondary | ICD-10-CM | POA: Diagnosis not present

## 2016-04-13 DIAGNOSIS — N949 Unspecified condition associated with female genital organs and menstrual cycle: Secondary | ICD-10-CM

## 2016-04-13 DIAGNOSIS — K219 Gastro-esophageal reflux disease without esophagitis: Secondary | ICD-10-CM | POA: Diagnosis present

## 2016-04-13 DIAGNOSIS — Z8249 Family history of ischemic heart disease and other diseases of the circulatory system: Secondary | ICD-10-CM | POA: Diagnosis not present

## 2016-04-13 DIAGNOSIS — O1414 Severe pre-eclampsia complicating childbirth: Principal | ICD-10-CM | POA: Diagnosis present

## 2016-04-13 DIAGNOSIS — Z201 Contact with and (suspected) exposure to tuberculosis: Secondary | ICD-10-CM

## 2016-04-13 DIAGNOSIS — R109 Unspecified abdominal pain: Secondary | ICD-10-CM | POA: Diagnosis present

## 2016-04-13 HISTORY — DX: Other specified health status: Z78.9

## 2016-04-13 LAB — CBC
HEMATOCRIT: 31 % — AB (ref 36.0–46.0)
HEMOGLOBIN: 10.9 g/dL — AB (ref 12.0–15.0)
MCH: 30.4 pg (ref 26.0–34.0)
MCHC: 35.2 g/dL (ref 30.0–36.0)
MCV: 86.4 fL (ref 78.0–100.0)
Platelets: 214 10*3/uL (ref 150–400)
RBC: 3.59 MIL/uL — AB (ref 3.87–5.11)
RDW: 14.3 % (ref 11.5–15.5)
WBC: 7.3 10*3/uL (ref 4.0–10.5)

## 2016-04-13 LAB — URINALYSIS, ROUTINE W REFLEX MICROSCOPIC
BILIRUBIN URINE: NEGATIVE
Glucose, UA: NEGATIVE mg/dL
Ketones, ur: NEGATIVE mg/dL
Leukocytes, UA: NEGATIVE
NITRITE: NEGATIVE
PROTEIN: 100 mg/dL — AB
Specific Gravity, Urine: 1.015 (ref 1.005–1.030)
pH: 6 (ref 5.0–8.0)

## 2016-04-13 LAB — COMPREHENSIVE METABOLIC PANEL
ALT: 19 U/L (ref 14–54)
ANION GAP: 8 (ref 5–15)
AST: 23 U/L (ref 15–41)
Albumin: 2.8 g/dL — ABNORMAL LOW (ref 3.5–5.0)
Alkaline Phosphatase: 182 U/L — ABNORMAL HIGH (ref 38–126)
BUN: 9 mg/dL (ref 6–20)
CHLORIDE: 105 mmol/L (ref 101–111)
CO2: 24 mmol/L (ref 22–32)
CREATININE: 0.69 mg/dL (ref 0.44–1.00)
Calcium: 8.7 mg/dL — ABNORMAL LOW (ref 8.9–10.3)
Glucose, Bld: 90 mg/dL (ref 65–99)
POTASSIUM: 3.7 mmol/L (ref 3.5–5.1)
Sodium: 137 mmol/L (ref 135–145)
Total Bilirubin: 0.9 mg/dL (ref 0.3–1.2)
Total Protein: 6.3 g/dL — ABNORMAL LOW (ref 6.5–8.1)

## 2016-04-13 LAB — CBC WITH DIFFERENTIAL/PLATELET
Basophils Absolute: 0 10*3/uL (ref 0.0–0.1)
Basophils Relative: 0 %
EOS PCT: 1 %
Eosinophils Absolute: 0.1 10*3/uL (ref 0.0–0.7)
HCT: 31.3 % — ABNORMAL LOW (ref 36.0–46.0)
Hemoglobin: 11 g/dL — ABNORMAL LOW (ref 12.0–15.0)
LYMPHS ABS: 1.7 10*3/uL (ref 0.7–4.0)
LYMPHS PCT: 26 %
MCH: 30.6 pg (ref 26.0–34.0)
MCHC: 35.1 g/dL (ref 30.0–36.0)
MCV: 87.2 fL (ref 78.0–100.0)
MONO ABS: 0.4 10*3/uL (ref 0.1–1.0)
Monocytes Relative: 6 %
Neutro Abs: 4.5 10*3/uL (ref 1.7–7.7)
Neutrophils Relative %: 67 %
PLATELETS: 202 10*3/uL (ref 150–400)
RBC: 3.59 MIL/uL — ABNORMAL LOW (ref 3.87–5.11)
RDW: 14.2 % (ref 11.5–15.5)
WBC: 6.7 10*3/uL (ref 4.0–10.5)

## 2016-04-13 LAB — PROTEIN / CREATININE RATIO, URINE
CREATININE, URINE: 136 mg/dL
PROTEIN CREATININE RATIO: 1.21 mg/mg{creat} — AB (ref 0.00–0.15)
TOTAL PROTEIN, URINE: 164 mg/dL

## 2016-04-13 LAB — URINE MICROSCOPIC-ADD ON

## 2016-04-13 LAB — TYPE AND SCREEN
ABO/RH(D): B POS
ANTIBODY SCREEN: NEGATIVE

## 2016-04-13 MED ORDER — TERBUTALINE SULFATE 1 MG/ML IJ SOLN: 0.2500 mg | Freq: Once | INTRAMUSCULAR | Status: DC | PRN

## 2016-04-13 MED ORDER — LIDOCAINE HCL (PF) 1 % IJ SOLN
30.0000 mL | INTRAMUSCULAR | Status: AC | PRN
  Administered 2016-04-16: 30 mL via SUBCUTANEOUS
  Filled 2016-04-13: qty 30

## 2016-04-13 MED ORDER — OXYTOCIN 40 UNITS IN LACTATED RINGERS INFUSION - SIMPLE MED
2.5000 [IU]/h | INTRAVENOUS | Status: DC
  Filled 2016-04-13 (×2): qty 1000

## 2016-04-13 MED ORDER — OXYCODONE-ACETAMINOPHEN 5-325 MG PO TABS: 1.0000 | ORAL_TABLET | ORAL | Status: DC | PRN

## 2016-04-13 MED ORDER — FLEET ENEMA 7-19 GM/118ML RE ENEM: 1.0000 | ENEMA | RECTAL | Status: DC | PRN

## 2016-04-13 MED ORDER — SOD CITRATE-CITRIC ACID 500-334 MG/5ML PO SOLN: 30.0000 mL | ORAL | Status: DC | PRN

## 2016-04-13 MED ORDER — MISOPROSTOL 25 MCG QUARTER TABLET
25.0000 ug | ORAL_TABLET | ORAL | Status: DC | PRN
  Administered 2016-04-13 – 2016-04-14 (×3): 25 ug via VAGINAL
  Filled 2016-04-13 (×3): qty 0.25
  Filled 2016-04-13: qty 1

## 2016-04-13 MED ORDER — LACTATED RINGERS IV SOLN
500.0000 mL | INTRAVENOUS | Status: DC | PRN
Start: 2016-04-13 — End: 2016-04-16
  Administered 2016-04-14: 500 mL via INTRAVENOUS

## 2016-04-13 MED ORDER — ONDANSETRON HCL 4 MG/2ML IJ SOLN: 4.0000 mg | Freq: Four times a day (QID) | INTRAMUSCULAR | Status: DC | PRN

## 2016-04-13 MED ORDER — OXYCODONE-ACETAMINOPHEN 5-325 MG PO TABS: 2.0000 | ORAL_TABLET | ORAL | Status: DC | PRN

## 2016-04-13 MED ORDER — ACETAMINOPHEN 325 MG PO TABS
650.0000 mg | ORAL_TABLET | ORAL | Status: DC | PRN
  Administered 2016-04-14 – 2016-04-15 (×2): 650 mg via ORAL
  Filled 2016-04-13 (×2): qty 2

## 2016-04-13 MED ORDER — OXYTOCIN BOLUS FROM INFUSION
500.0000 mL | Freq: Once | INTRAVENOUS | Status: AC
  Administered 2016-04-16: 500 mL via INTRAVENOUS

## 2016-04-13 MED ORDER — LACTATED RINGERS IV SOLN
INTRAVENOUS | Status: DC
  Administered 2016-04-13 – 2016-04-15 (×5): via INTRAVENOUS

## 2016-04-13 NOTE — H&P (Signed)
History   [name] is a 25 yo G2P0010 @ 37.5 wks who presents with N/V, and left side abd pain since last pm. Pt states she hasn't been able to sleep, and usually vomits after she eats or drinks something. Pt  States she has had severe heartburn throughout this pregn.  CSN: [id number]  Arrival date & time 04/13/16  1237   None        Chief Complaint  Patient presents with  . Leg Swelling  . Emesis During Pregnancy  . Abdominal Pain    HPI      Past Medical History:  Diagnosis Date  . Dysmenorrhea   . Medical history non-contributory          Past Surgical History:  Procedure Laterality Date  . HERNIA REPAIR  2011   umbilical          Family History  Problem Relation Age of Onset  . Hypertension Mother         Social History  Substance Use Topics  . Smoking status: Never Smoker  . Smokeless tobacco: Never Used  . Alcohol use No            OB History    Gravida Para Term Preterm AB Living   2       1 0   SAB TAB Ectopic Multiple Live Births   1              Review of Systems  Constitutional: Negative.   HENT: Negative.   Eyes: Negative.   Respiratory: Negative.   Cardiovascular: Negative.   Gastrointestinal: Positive for abdominal pain.  Endocrine: Negative.   Genitourinary: Negative.   Musculoskeletal: Negative.   Skin: Negative.   Allergic/Immunologic: Negative.   Neurological: Negative.   Hematological: Negative.   Psychiatric/Behavioral: Negative.     Allergies  Coconut flavor; Fish allergy; and Pineapple  Home Medications    BP 126/84 (BP Location: Right Arm)   Pulse 82   Temp 98.2 F (36.8 C) (Oral)   Resp 18   Wt 229 lb (103.9 kg) Comment: large scale in triage  LMP 07/31/2015 (Approximate)   BMI 34.56 kg/m   Physical Exam  Constitutional: She is oriented to person, place, and time. She appears well-developed and well-nourished.  HENT:  Head: Normocephalic and atraumatic.  Eyes:  Conjunctivae are normal. Pupils are equal, round, and reactive to light.  Neck: Normal range of motion. Neck supple.  Cardiovascular: Normal rate and regular rhythm.   Pulmonary/Chest: Effort normal and breath sounds normal.  Abdominal: Soft. Bowel sounds are normal.  Genitourinary: Vagina normal and uterus normal.  Musculoskeletal: Normal range of motion.  Neurological: She is alert and oriented to person, place, and time.  Skin: Skin is warm and dry.  Psychiatric: She has a normal mood and affect. Her behavior is normal. Judgment and thought content normal.    MAU Course  Procedures (including critical care time)  Labs Reviewed  URINALYSIS, ROUTINE W REFLEX MICROSCOPIC (NOT AT [facility])   Imaging Results (Last 48 hours)  No results found.     1. Exposure to TB       MDM  1. IUP @ 37.5 wks   2. Left side round ligament pain  3. GERD Pre eclampsia  Plan: p/c ratio elevated, BP elevated. POC discussed with Dr. Alysia Penna. Pt to be admitted and induced. POC discussed with pt and she verbalized understanding.         Cosigned by: Hermina Staggers,  MD at 04/13/2016 3:21 PM

## 2016-04-13 NOTE — MAU Note (Signed)
Past few days, legs have been swelling so so bad.  Rt foot worse than left.  Feels like it is burning in her rt foot.  Having pain in lower abd.  Can't keep anything down, keeps throwing up.  Denies headache, visual changes or epigastric pain.

## 2016-04-13 NOTE — MAU Note (Signed)
Pt's DC B/P ordered, discharge cancelled, labs ordered.

## 2016-04-13 NOTE — Progress Notes (Signed)
LABOR PROGRESS NOTE  Diane [name] is a 25 y.o. G2P0010 at 2641w5d  admitted for IOL for pre-E.   Subjective: Doing well. States she just wants the baby to be okay. No headaches, no blurry vision, no epigastric/RUQ pain.  Objective: BP 153/96   Pulse 78   Temp 98.5 F (36.9 C) (Oral)   Resp 20   Ht 5\' 6"  (1.676 m)   Wt 103.9 kg (229 lb)   LMP 07/31/2015 (Approximate)   BMI 36.96 kg/m  or  Vitals:   04/13/16 1713 04/13/16 1728 04/13/16 1744 04/13/16 2019  BP: 138/92 147/94 153/96   Pulse: 96 79 78   Resp:    20  Temp:    98.5 F (36.9 C)  TempSrc:    Oral  Weight:    103.9 kg (229 lb)  Height:    5\' 6"  (1.676 m)    Dilation: Closed Effacement (%): 50 Cervical Position: Posterior Station: -3 Exam by:: ARennis Harding. Ellis CNM  Labs: Lab Results  Component Value Date   WBC 7.3 04/13/2016   HGB 10.9 (L) 04/13/2016   HCT 31.0 (L) 04/13/2016   MCV 86.4 04/13/2016   PLT 214 04/13/2016    Patient Active Problem List   Diagnosis Date Noted  . Preeclampsia, third trimester 04/13/2016  . 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 Supervision of low-risk first pregnancy 01/18/2016  . Umbilical hernia 09/05/2015    Assessment / Plan: 25 y.o. G2P0010 at 6941w5d here for IOL for pre-E.  Labor: Cytotec given x 1. Will place Foley when able. Fetal Wellbeing:  Category I Pain Control:  Does not want epidural Anticipated MOD:  SVD  Hilton SinclairKaty D Angelo Prindle, MD 04/13/2016, 8:41 PM

## 2016-04-13 NOTE — MAU Provider Note (Signed)
History   Diane [name] is a 25 yo G2P0010 @ 37.5 wks who presents with N/V, and left side abd pain since last pm. Pt states she hasn't been able to sleep, and usually vomits after she eats or drinks something. Pt  States she has had severe heartburn throughout this pregn.  CSN: [id number]  Arrival date & time 04/13/16  1237   None     Chief Complaint  Patient presents with  . Leg Swelling  . Emesis During Pregnancy  . Abdominal Pain    HPI  Past Medical History:  Diagnosis Date  . Dysmenorrhea   . Medical history non-contributory     Past Surgical History:  Procedure Laterality Date  . HERNIA REPAIR  2011   umbilical     Family History  Problem Relation Age of Onset  . Hypertension Mother     Social History  Substance Use Topics  . Smoking status: Never Smoker  . Smokeless tobacco: Never Used  . Alcohol use No    OB History    Gravida Para Term Preterm AB Living   2       1 0   SAB TAB Ectopic Multiple Live Births   1              Review of Systems  Constitutional: Negative.   HENT: Negative.   Eyes: Negative.   Respiratory: Negative.   Cardiovascular: Negative.   Gastrointestinal: Positive for abdominal pain.  Endocrine: Negative.   Genitourinary: Negative.   Musculoskeletal: Negative.   Skin: Negative.   Allergic/Immunologic: Negative.   Neurological: Negative.   Hematological: Negative.   Psychiatric/Behavioral: Negative.     Allergies  Coconut flavor; Fish allergy; and Pineapple  Home Medications    BP 126/84 (BP Location: Right Arm)   Pulse 82   Temp 98.2 F (36.8 C) (Oral)   Resp 18   Wt 229 lb (103.9 kg) Comment: large scale in triage  LMP 07/31/2015 (Approximate)   BMI 34.56 kg/m   Physical Exam  Constitutional: She is oriented to person, place, and time. She appears well-developed and well-nourished.  HENT:  Head: Normocephalic and atraumatic.  Eyes: Conjunctivae are normal. Pupils are equal, round, and reactive to light.   Neck: Normal range of motion. Neck supple.  Cardiovascular: Normal rate and regular rhythm.   Pulmonary/Chest: Effort normal and breath sounds normal.  Abdominal: Soft. Bowel sounds are normal.  Genitourinary: Vagina normal and uterus normal.  Musculoskeletal: Normal range of motion.  Neurological: She is alert and oriented to person, place, and time.  Skin: Skin is warm and dry.  Psychiatric: She has a normal mood and affect. Her behavior is normal. Judgment and thought content normal.    MAU Course  Procedures (including critical care time)  Labs Reviewed  URINALYSIS, ROUTINE W REFLEX MICROSCOPIC (NOT AT [facility])   No results found.   1. Exposure to TB       MDM  1. IUP @ 37.5 wks   2. Left side round ligament pain  3. GERD  Plan: Discussed common discomforts of third trimester in preg., reviewed common discomforts and home measures that may be taken to relieve discomfort; will f/u in clinic next Thursday for ROB

## 2016-04-14 DIAGNOSIS — O1493 Unspecified pre-eclampsia, third trimester: Secondary | ICD-10-CM

## 2016-04-14 DIAGNOSIS — Z3A37 37 weeks gestation of pregnancy: Secondary | ICD-10-CM

## 2016-04-14 LAB — RPR: RPR: NONREACTIVE

## 2016-04-14 LAB — HIV ANTIBODY (ROUTINE TESTING W REFLEX): HIV Screen 4th Generation wRfx: NONREACTIVE

## 2016-04-14 MED ORDER — OXYTOCIN 40 UNITS IN LACTATED RINGERS INFUSION - SIMPLE MED: 1.0000 m[IU]/min | INTRAVENOUS | Status: DC

## 2016-04-14 MED ORDER — FENTANYL CITRATE (PF) 100 MCG/2ML IJ SOLN
100.0000 ug | INTRAMUSCULAR | Status: DC | PRN
  Administered 2016-04-14 – 2016-04-16 (×4): 100 ug via INTRAVENOUS
  Filled 2016-04-14 (×4): qty 2

## 2016-04-14 MED ORDER — OXYTOCIN 40 UNITS IN LACTATED RINGERS INFUSION - SIMPLE MED
1.0000 m[IU]/min | INTRAVENOUS | Status: DC
  Administered 2016-04-14: 2 m[IU]/min via INTRAVENOUS

## 2016-04-14 MED ORDER — HYDRALAZINE HCL 20 MG/ML IJ SOLN
10.0000 mg | Freq: Once | INTRAMUSCULAR | Status: DC | PRN
  Filled 2016-04-14: qty 1

## 2016-04-14 MED ORDER — MAGNESIUM SULFATE 50 % IJ SOLN
2.0000 g/h | INTRAMUSCULAR | Status: DC
  Administered 2016-04-15 (×2): 2 g/h via INTRAVENOUS
  Filled 2016-04-14 (×3): qty 80

## 2016-04-14 MED ORDER — LABETALOL HCL 5 MG/ML IV SOLN
INTRAVENOUS | Status: AC
  Filled 2016-04-14: qty 4

## 2016-04-14 MED ORDER — MAGNESIUM SULFATE BOLUS VIA INFUSION
4.0000 g | Freq: Once | INTRAVENOUS | Status: AC
  Administered 2016-04-14: 4 g via INTRAVENOUS
  Filled 2016-04-14: qty 500

## 2016-04-14 MED ORDER — LABETALOL HCL 5 MG/ML IV SOLN
20.0000 mg | INTRAVENOUS | Status: AC | PRN
  Administered 2016-04-14 – 2016-04-15 (×3): 20 mg via INTRAVENOUS
  Filled 2016-04-14 (×2): qty 4

## 2016-04-14 MED ORDER — TERBUTALINE SULFATE 1 MG/ML IJ SOLN: 0.2500 mg | Freq: Once | INTRAMUSCULAR | Status: DC | PRN

## 2016-04-14 NOTE — Progress Notes (Signed)
Patient ID: Diane [name], female   DOB: 1991-03-18, 25 y.o.   MRN: [id number]   S: Patient seen & examined for progress of IOL. Patient sitting at edge of bed, comfortable but feeling some contractions. Discussed further IOL with Foley bulb placement, patient was agreeable.  O:  Vitals:   04/14/16 0800 04/14/16 0830  BP: (!) 143/92 (!) 156/93  Pulse: (!) 107 83  Resp: 18 18  Temp:     Dilation: 1.5 Effacement (%): 60 Cervical Position: Posterior Station: -2 Presentation: Vertex Exam by:: Dr. Nancy MarusMayo  Foley bulb was placed and insufflated to 60cc with saline. Patient tolerated procedure well.  FHT: 130, mod var, +accels, no decels. TOCO: q2-3 min   A/P: 25 y.o. G2P0010 5085w6d here for IOL 2/2 preeclampsia, now with severe features (Severe BPs)  Continue with IOL  Started Mag gtt with Labetalol protocol FB placed, continue with cytotec vs pitocin Monitor BPs and symptoms Strict I/Os

## 2016-04-14 NOTE — Progress Notes (Signed)
LABOR PROGRESS NOTE  Diane [name] is a 25 y.o. G2P0010 at 319w6d  admitted for IOL for pre-E without severe features.  Subjective: Pt doing well. States she is in 8/10 pain but does not want any IV pain medications.  Objective: BP (!) 136/101   Pulse 71   Temp 98.2 F (36.8 C)   Resp 18   Ht 5\' 6"  (1.676 m)   Wt 103.9 kg (229 lb)   LMP 07/31/2015 (Approximate)   BMI 36.96 kg/m  or  Vitals:   04/13/16 2345 04/14/16 0000 04/14/16 0100 04/14/16 0130  BP: 118/81 140/80 122/81 (!) 136/101  Pulse: 74 75 61 71  Resp: 18  18 18   Temp:      TempSrc:      Weight:      Height:        Dilation: Fingertip Effacement (%): 50 Cervical Position: Posterior Station: -3 Presentation: Vertex Exam by:: Dr. Nancy MarusMayo  Labs: Lab Results  Component Value Date   WBC 7.3 04/13/2016   HGB 10.9 (L) 04/13/2016   HCT 31.0 (L) 04/13/2016   MCV 86.4 04/13/2016   PLT 214 04/13/2016    Patient Active Problem List   Diagnosis Date Noted  . Preeclampsia, third trimester 04/13/2016  . 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 Supervision of low-risk first pregnancy 01/18/2016  . Umbilical hernia 09/05/2015    Assessment / Plan: 25 y.o. G2P0010 at 5519w6d here for IOL for pre-E without severe features.  Labor: Progressing slowly. Could not pass a Foley bulb because her cervix is only fingertip and too posterior. Will give another dose of Cytotec. Fetal Wellbeing:  Category I Pain Control:  Does not want pain medications at this time. Anticipated MOD:  SVD  Hilton SinclairKaty D Jlen Wintle, MD 04/14/2016, 1:45 AM

## 2016-04-14 NOTE — Progress Notes (Signed)
LABOR PROGRESS NOTE  Diane [name] is a 25 y.o. G2P0010 at 3117w6d  admitted for IOL for pre-E without severe features.  Subjective: Got called from the nurse that Pt has had three elevated blood pressures in a row over the last 10 minutes. Pt denies headaches, blurred vision, or RUQ/epigastric abdominal pain.  Objective: BP (!) 165/93   Pulse 70   Temp 98.7 F (37.1 C) (Oral)   Resp 18   Ht 5\' 6"  (1.676 m)   Wt 103.9 kg (229 lb)   LMP 07/31/2015 (Approximate)   BMI 36.96 kg/m  or  Vitals:   04/14/16 0605 04/14/16 0630 04/14/16 0640 04/14/16 0643  BP: (!) 155/103 (!) 168/91 (!) 166/90 (!) 165/93  Pulse: 62 69 70 70  Resp: 18 18 18 18   Temp:      TempSrc:      Weight:      Height:       Dilation: 1.5 Effacement (%): 60 Cervical Position: Posterior Station: -2 Presentation: Vertex Exam by:: Dr. Nancy MarusMayo  Labs: Lab Results  Component Value Date   WBC 7.3 04/13/2016   HGB 10.9 (L) 04/13/2016   HCT 31.0 (L) 04/13/2016   MCV 86.4 04/13/2016   PLT 214 04/13/2016    Patient Active Problem List   Diagnosis Date Noted  . Preeclampsia, third trimester 04/13/2016  . 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 Supervision of low-risk first pregnancy 01/18/2016  . Umbilical hernia 09/05/2015    Assessment / Plan: 25 y.o. G2P0010 at 3217w6d here for IOL for pre-E without severe features.  Blood pressure: Pt has just had three BP readings >160/90. No symptoms. She is now pre-E with severe features (elevated BPs). Will start Mag gtt and Labetalol protocol. Labor: s/p Cytotec x 3. Have been unable to place a Foley bulb. Fetal Wellbeing:  Category I Pain Control:  Doing well without pain medications for now Anticipated MOD:  SVD  Hilton SinclairKaty D Denver Bentson, MD 04/14/2016, 6:51 AM

## 2016-04-14 NOTE — Progress Notes (Signed)
Diane [name] is a 25 y.o. G2P0010 at 2971w6d by ultrasound admitted for induction of labor due to Hypertension.  Subjective:   Objective: BP (!) 157/92   Pulse 89   Temp 98.8 F (37.1 C) (Axillary)   Resp 20   Ht 5\' 6"  (1.676 m)   Wt 229 lb (103.9 kg)   LMP 07/31/2015 (Approximate)   SpO2 100%   BMI 36.96 kg/m  I/O last 3 completed shifts: In: 2279 [P.O.:720; I.V.:1559] Out: 1550 [Urine:1550] Total I/O In: 160.3 [I.V.:160.3] Out: 0   FHT:  FHR: 130's bpm, variability: moderate,  accelerations:  Present,  decelerations:  Absent UC:   irregular, every 4-6 minutes SVE:   Dilation: 2 Effacement (%): 70 Station: -2 Exam by:: k fields, rn  Labs: Lab Results  Component Value Date   WBC 7.3 04/13/2016   HGB 10.9 (L) 04/13/2016   HCT 31.0 (L) 04/13/2016   MCV 86.4 04/13/2016   PLT 214 04/13/2016    Assessment / Plan: IOL, not yet in labor, continue oxytocin  Labor: not yet in labor  Preeclampsia:  no signs or symptoms of toxicity, intake and ouput balanced and labs stable Fetal Wellbeing:  Category I Pain Control:  IV pain meds I/D:  n/a Anticipated MOD:  NSVD  Diane [name] 04/14/2016, 8:12 PM

## 2016-04-14 NOTE — Progress Notes (Signed)
Parks RangerMorgane Thorner is a 25 y.o. G2P0010 at 433w6d by ultrasound admitted for induction of labor due to Hypertension.  Subjective:   Objective: BP (!) 166/103   Pulse 89   Temp 98.8 F (37.1 C) (Axillary)   Resp 16   Ht 5\' 6"  (1.676 m)   Wt 229 lb (103.9 kg)   LMP 07/31/2015 (Approximate)   SpO2 100%   BMI 36.96 kg/m  I/O last 3 completed shifts: In: 2279 [P.O.:720; I.V.:1559] Out: 1550 [Urine:1550] Total I/O In: 640.3 [P.O.:480; I.V.:160.3] Out: 600 [Urine:600]  FHT:  FHR: 130's bpm, variability: moderate,  accelerations:  Present,  decelerations:  Absent UC:   irregular, every 1-6 minutes SVE:   Dilation: 2 Effacement (%): 70 Station: -2 Exam by:: k fields, rn  Labs: Lab Results  Component Value Date   WBC 7.3 04/13/2016   HGB 10.9 (L) 04/13/2016   HCT 31.0 (L) 04/13/2016   MCV 86.4 04/13/2016   PLT 214 04/13/2016    Assessment / Plan: IOL, not yet in labor ; consider oxytocin later this pm   Labor: Not yet in labor  Preeclampsia:  on magnesium sulfate, no signs or symptoms of toxicity, intake and ouput balanced and labs stable Fetal Wellbeing:  Category I Pain Control:  IV pain meds I/D:  n/a Anticipated MOD:  NSVD  Wyvonnia DuskyMarie Alyce Inscore 04/14/2016, 8:33 PM

## 2016-04-14 NOTE — Progress Notes (Signed)
[name] is a 25 y.o. G2P0010 at 5452w6d by ultrasound admitted for induction of labor due to Hypertension.  Subjective:   Objective: BP (!) 158/88   Pulse 78   Temp 98.3 F (36.8 C) (Oral)   Resp 18   Ht 5\' 6"  (1.676 m)   Wt 229 lb (103.9 kg)   LMP 07/31/2015 (Approximate)   SpO2 100%   BMI 36.96 kg/m  No intake/output data recorded. Total I/O In: 175 [I.V.:175] Out: 350 [Urine:350]  FHT:  FHR: 130's bpm, variability: moderate,  accelerations:  Present,  decelerations:  Absent UC:   regular, every 3-5 minutes SVE:   Dilation: 1.5 Effacement (%): 60 Station: -2 Exam by:: Dr. Nancy [name]  Labs: Lab Results  Component Value Date   WBC 7.3 04/13/2016   HGB 10.9 (L) 04/13/2016   HCT 31.0 (L) 04/13/2016   MCV 86.4 04/13/2016   PLT 214 04/13/2016    Assessment / Plan: IOL, not yet in labor  Labor: not yet in labor  Preeclampsia:  on magnesium sulfate, no signs or symptoms of toxicity, intake and ouput balanced and labs stable Fetal Wellbeing:  Category I Pain Control:  IV pain meds I/D:  n/a Anticipated MOD:  NSVD  Diane [name] 04/14/2016, 9:15 AM

## 2016-04-14 NOTE — Anesthesia Pain Management Evaluation Note (Signed)
  CRNA Pain Management Visit Note  Patient: Diane [name], 25 y.o., female  "Hello I am a [facility] the [facility]. We have an anesthesia team available at all times to provide care throughout the hospital, including epidural management and anesthesia for C-section. I don't know your plan for the delivery whether it a natural birth, water birth, IV sedation, nitrous supplementation, doula or epidural, but we want to meet your pain goals."   1.Was your pain managed to your expectations on prior hospitalizations?   No prior hospitalizations  2.What is your expectation for pain management during this hospitalization?     IV pain meds  3.How can we help you reach that goal? Possible epidural.  Record the patient's initial score and the patient's pain goal.   Pain: 8  Pain Goal: 8 [facility] wants you to be able to say your pain was always managed very well.  Diane [name] 04/14/2016

## 2016-04-15 LAB — MAGNESIUM: Magnesium: 5 mg/dL — ABNORMAL HIGH (ref 1.7–2.4)

## 2016-04-15 MED ORDER — BETAMETHASONE SOD PHOS & ACET 6 (3-3) MG/ML IJ SUSP
12.0000 mg | Freq: Once | INTRAMUSCULAR | Status: DC
  Filled 2016-04-15: qty 2

## 2016-04-15 MED ORDER — LABETALOL HCL 5 MG/ML IV SOLN
INTRAVENOUS | Status: AC
  Filled 2016-04-15: qty 8

## 2016-04-15 MED ORDER — HYDRALAZINE HCL 20 MG/ML IJ SOLN
10.0000 mg | Freq: Once | INTRAMUSCULAR | Status: AC
  Administered 2016-04-15: 10 mg via INTRAMUSCULAR

## 2016-04-15 NOTE — Progress Notes (Signed)
Patient ID: Diane [name], female   DOB: 04-12-1991, 25 y.o.   MRN: [id number] Stable with IOL for preeclampsia  BP  139/86 DTRs 3+ No clonus  FHR reassuring Category I  Cervix exam deferred

## 2016-04-15 NOTE — Progress Notes (Signed)
CSW acknowledges consult and will meet with patient after L&D.   Blaine HamperAngel Boyd-Gilyard, MSW, LCSW Clinical Social Work [phone]

## 2016-04-15 NOTE — Progress Notes (Signed)
Patient ID: Diane [name], female   DOB: Nov 08, 1990, 25 y.o.   MRN: [id number]  Patient seen - category 1 tracing. Pt comfortable. Discussed plan of breaking water and placing IUPC as pt still not in labor. Pt declined at this point - would like to think about it.  Levie HeritageJacob J Youa Deloney, DO 04/15/2016 7:16 PM

## 2016-04-15 NOTE — Progress Notes (Signed)
Diane [name] is a 25 y.o. G2P0010 at 6371w0d by ultrasound admitted for induction of labor due to Pre-eclamptic toxemia of pregnancy..  Subjective:   Objective: BP (!) 153/96   Pulse 100   Temp 98.1 F (36.7 C) (Oral)   Resp 16   Ht 5\' 6"  (1.676 m)   Wt 229 lb (103.9 kg)   LMP 07/31/2015 (Approximate)   SpO2 100%   BMI 36.96 kg/m  I/O last 3 completed shifts: In: 2279 [P.O.:720; I.V.:1559] Out: 1550 [Urine:1550] Total I/O In: 2226 [P.O.:840; I.V.:1386] Out: 1650 [Urine:1650]  FHT:  FHR: 130's bpm, variability: moderate,  accelerations:  Abscent,  decelerations:  Absent UC:   irregular, every 4-6 minutes SVE:   Dilation: 4 Effacement (%): 70 Station: -2 Exam by:: Melburn PopperMichelle Williams, RN  Labs: Lab Results  Component Value Date   WBC 7.3 04/13/2016   HGB 10.9 (L) 04/13/2016   HCT 31.0 (L) 04/13/2016   MCV 86.4 04/13/2016   PLT 214 04/13/2016    Assessment / Plan: Induction of labor due to preeclampsia,  progressing well on pitocin  Labor: Progressing on Pitocin, will continue to increase then AROM Preeclampsia:  on magnesium sulfate Fetal Wellbeing:  Category I Pain Control:  Labor support without medications I/D:  n/a Anticipated MOD:  NSVD  Diane [name] 04/15/2016, 5:52 AM

## 2016-04-16 ENCOUNTER — Encounter (HOSPITAL_COMMUNITY): Payer: Self-pay | Admitting: *Deleted

## 2016-04-16 ENCOUNTER — Inpatient Hospital Stay (HOSPITAL_COMMUNITY): Payer: Medicaid Other | Admitting: Anesthesiology

## 2016-04-16 DIAGNOSIS — O1494 Unspecified pre-eclampsia, complicating childbirth: Secondary | ICD-10-CM

## 2016-04-16 DIAGNOSIS — Z3A37 37 weeks gestation of pregnancy: Secondary | ICD-10-CM

## 2016-04-16 LAB — CBC
HCT: 31.4 % — ABNORMAL LOW (ref 36.0–46.0)
HCT: 30.3 % — ABNORMAL LOW (ref 36.0–46.0)
HEMOGLOBIN: 10.6 g/dL — AB (ref 12.0–15.0)
HEMOGLOBIN: 11.2 g/dL — AB (ref 12.0–15.0)
MCH: 31 pg (ref 26.0–34.0)
MCH: 30.8 pg (ref 26.0–34.0)
MCHC: 35.7 g/dL (ref 30.0–36.0)
MCHC: 35 g/dL (ref 30.0–36.0)
MCV: 87 fL (ref 78.0–100.0)
MCV: 88.1 fL (ref 78.0–100.0)
PLATELETS: 198 10*3/uL (ref 150–400)
Platelets: 115 10*3/uL — ABNORMAL LOW (ref 150–400)
RBC: 3.44 MIL/uL — AB (ref 3.87–5.11)
RBC: 3.61 MIL/uL — AB (ref 3.87–5.11)
RDW: 14.3 % (ref 11.5–15.5)
RDW: 14.3 % (ref 11.5–15.5)
WBC: 7.4 10*3/uL (ref 4.0–10.5)
WBC: 11.6 10*3/uL — AB (ref 4.0–10.5)

## 2016-04-16 MED ORDER — WITCH HAZEL-GLYCERIN EX PADS: 1.0000 "application " | MEDICATED_PAD | CUTANEOUS | Status: DC | PRN

## 2016-04-16 MED ORDER — DIPHENHYDRAMINE HCL 25 MG PO CAPS: 25.0000 mg | ORAL_CAPSULE | Freq: Four times a day (QID) | ORAL | Status: DC | PRN

## 2016-04-16 MED ORDER — ONDANSETRON HCL 4 MG/2ML IJ SOLN: 4.0000 mg | INTRAMUSCULAR | Status: DC | PRN

## 2016-04-16 MED ORDER — PHENYLEPHRINE 40 MCG/ML (10ML) SYRINGE FOR IV PUSH (FOR BLOOD PRESSURE SUPPORT)
80.0000 ug | PREFILLED_SYRINGE | INTRAVENOUS | Status: DC | PRN
  Filled 2016-04-16: qty 10
  Filled 2016-04-16: qty 5

## 2016-04-16 MED ORDER — DIBUCAINE 1 % RE OINT: 1.0000 "application " | TOPICAL_OINTMENT | RECTAL | Status: DC | PRN

## 2016-04-16 MED ORDER — SODIUM CHLORIDE 0.9 % IV SOLN: 250.0000 mL | INTRAVENOUS | Status: DC | PRN

## 2016-04-16 MED ORDER — LACTATED RINGERS IV SOLN: 500.0000 mL | Freq: Once | INTRAVENOUS | Status: DC

## 2016-04-16 MED ORDER — SIMETHICONE 80 MG PO CHEW
80.0000 mg | CHEWABLE_TABLET | ORAL | Status: DC | PRN
Start: 2016-04-16 — End: 2016-04-18

## 2016-04-16 MED ORDER — DOCUSATE SODIUM 100 MG PO CAPS
100.0000 mg | ORAL_CAPSULE | Freq: Two times a day (BID) | ORAL | Status: DC
  Administered 2016-04-16 – 2016-04-18 (×4): 100 mg via ORAL
  Filled 2016-04-16 (×4): qty 1

## 2016-04-16 MED ORDER — OXYTOCIN 40 UNITS IN LACTATED RINGERS INFUSION - SIMPLE MED: 2.5000 [IU]/h | INTRAVENOUS | Status: DC | PRN

## 2016-04-16 MED ORDER — SODIUM CHLORIDE 0.9% FLUSH: 3.0000 mL | INTRAVENOUS | Status: DC | PRN

## 2016-04-16 MED ORDER — CARBOPROST TROMETHAMINE 250 MCG/ML IM SOLN
INTRAMUSCULAR | Status: AC
  Filled 2016-04-16: qty 1

## 2016-04-16 MED ORDER — EPHEDRINE 5 MG/ML INJ
10.0000 mg | INTRAVENOUS | Status: DC | PRN
  Filled 2016-04-16: qty 4

## 2016-04-16 MED ORDER — CARBOPROST TROMETHAMINE 250 MCG/ML IM SOLN: 250.0000 ug | Freq: Once | INTRAMUSCULAR | Status: DC

## 2016-04-16 MED ORDER — SENNOSIDES-DOCUSATE SODIUM 8.6-50 MG PO TABS
2.0000 | ORAL_TABLET | ORAL | Status: DC
  Administered 2016-04-17 – 2016-04-18 (×2): 2 via ORAL
  Filled 2016-04-16 (×2): qty 2

## 2016-04-16 MED ORDER — SODIUM CHLORIDE 0.9% FLUSH: 3.0000 mL | Freq: Two times a day (BID) | INTRAVENOUS | Status: DC

## 2016-04-16 MED ORDER — FENTANYL 2.5 MCG/ML BUPIVACAINE 1/10 % EPIDURAL INFUSION (WH - ANES)
14.0000 mL/h | INTRAMUSCULAR | Status: DC | PRN
  Administered 2016-04-16: 14 mL/h via EPIDURAL
  Filled 2016-04-16: qty 125

## 2016-04-16 MED ORDER — IBUPROFEN 600 MG PO TABS
600.0000 mg | ORAL_TABLET | Freq: Four times a day (QID) | ORAL | Status: DC
  Administered 2016-04-16 – 2016-04-18 (×9): 600 mg via ORAL
  Filled 2016-04-16 (×9): qty 1

## 2016-04-16 MED ORDER — BENZOCAINE-MENTHOL 20-0.5 % EX AERO
1.0000 "application " | INHALATION_SPRAY | CUTANEOUS | Status: DC | PRN
  Filled 2016-04-16: qty 56

## 2016-04-16 MED ORDER — ONDANSETRON HCL 4 MG PO TABS: 4.0000 mg | ORAL_TABLET | ORAL | Status: DC | PRN

## 2016-04-16 MED ORDER — MAGNESIUM SULFATE 50 % IJ SOLN
2.0000 g/h | INTRAVENOUS | Status: AC
  Administered 2016-04-16: 2 g/h via INTRAVENOUS
  Filled 2016-04-16 (×2): qty 80

## 2016-04-16 MED ORDER — ACETAMINOPHEN 325 MG PO TABS
650.0000 mg | ORAL_TABLET | ORAL | Status: DC | PRN
  Administered 2016-04-17: 650 mg via ORAL
  Filled 2016-04-16: qty 2

## 2016-04-16 MED ORDER — LACTATED RINGERS IV SOLN
INTRAVENOUS | Status: DC
  Administered 2016-04-17: 01:00:00 via INTRAVENOUS

## 2016-04-16 MED ORDER — ZOLPIDEM TARTRATE 5 MG PO TABS: 5.0000 mg | ORAL_TABLET | Freq: Every evening | ORAL | Status: DC | PRN

## 2016-04-16 MED ORDER — TETANUS-DIPHTH-ACELL PERTUSSIS 5-2.5-18.5 LF-MCG/0.5 IM SUSP: 0.5000 mL | Freq: Once | INTRAMUSCULAR | Status: DC

## 2016-04-16 MED ORDER — COCONUT OIL OIL: 1.0000 "application " | TOPICAL_OIL | Status: DC | PRN

## 2016-04-16 MED ORDER — MISOPROSTOL 200 MCG PO TABS
800.0000 ug | ORAL_TABLET | Freq: Once | ORAL | Status: AC
  Administered 2016-04-16: 800 ug via RECTAL

## 2016-04-16 MED ORDER — DIPHENHYDRAMINE HCL 50 MG/ML IJ SOLN: 12.5000 mg | INTRAMUSCULAR | Status: DC | PRN

## 2016-04-16 MED ORDER — LIDOCAINE HCL (PF) 1 % IJ SOLN
INTRAMUSCULAR | Status: DC | PRN
  Administered 2016-04-16: 7 mL via EPIDURAL
  Administered 2016-04-16: 8 mL via EPIDURAL

## 2016-04-16 MED ORDER — PRENATAL MULTIVITAMIN CH
1.0000 | ORAL_TABLET | Freq: Every day | ORAL | Status: DC
  Administered 2016-04-17 – 2016-04-18 (×2): 1 via ORAL
  Filled 2016-04-16 (×2): qty 1

## 2016-04-16 MED ORDER — MISOPROSTOL 200 MCG PO TABS
ORAL_TABLET | ORAL | Status: AC
  Filled 2016-04-16: qty 4

## 2016-04-16 NOTE — Anesthesia Procedure Notes (Signed)
Epidural Patient location during procedure: OB Start time: 04/16/2016 1:25 AM End time: 04/16/2016 1:30 AM  Staffing Anesthesiologist: Leilani AbleHATCHETT, Neilah Fulwider Performed: anesthesiologist   Preanesthetic Checklist Completed: patient identified, surgical consent, pre-op evaluation, timeout performed, IV checked, risks and benefits discussed and monitors and equipment checked  Epidural Patient position: sitting Prep: site prepped and draped and DuraPrep Patient monitoring: continuous pulse ox and blood pressure Approach: midline Location: L3-L4 Injection technique: LOR air  Needle:  Needle type: Tuohy  Needle gauge: 17 G Needle length: 9 cm and 9 Needle insertion depth: 7 cm Catheter type: closed end flexible Catheter size: 19 Gauge Catheter at skin depth: 12 cm Test dose: negative and Other  Assessment Sensory level: T9 Events: blood not aspirated, injection not painful, no injection resistance, negative IV test and no paresthesia  Additional Notes Reason for block:procedure for pain

## 2016-04-16 NOTE — Anesthesia Postprocedure Evaluation (Signed)
Anesthesia Post Note  Patient: Diane [name]  Procedure(s) Performed: * No procedures listed *  Patient location during evaluation: Mother Baby Anesthesia Type: Epidural Level of consciousness: awake and alert Pain management: pain level controlled Vital Signs Assessment: post-procedure vital signs reviewed and stable Respiratory status: spontaneous breathing, nonlabored ventilation and respiratory function stable Cardiovascular status: stable Postop Assessment: no headache, no backache and epidural receding Anesthetic complications: no     Last Vitals:  Vitals:   04/16/16 1000 04/16/16 1046  BP: 136/84 (!) 141/90  Pulse: 76 100  Resp: 18 20  Temp: 36.9 C 37 C    Last Pain:  Vitals:   04/16/16 1046  TempSrc: Axillary  PainSc:    Pain Goal: Patients Stated Pain Goal: 8 (04/16/16 0045)               Junious SilkGILBERT,Monick Rena

## 2016-04-16 NOTE — Anesthesia Preprocedure Evaluation (Signed)
Anesthesia Evaluation  Patient identified by MRN, date of birth, ID band Patient awake    Reviewed: Allergy & Precautions, H&P , NPO status , Patient's Chart, lab work & pertinent test results  Airway Mallampati: II  TM Distance: >3 FB Neck ROM: full    Dental no notable dental hx.    Pulmonary neg pulmonary ROS,    Pulmonary exam normal        Cardiovascular Normal cardiovascular exam     Neuro/Psych negative neurological ROS  negative psych ROS   GI/Hepatic negative GI ROS, Neg liver ROS,   Endo/Other  negative endocrine ROS  Renal/GU negative Renal ROS     Musculoskeletal   Abdominal (+) + obese,   Peds  Hematology negative hematology ROS (+)   Anesthesia Other Findings   Reproductive/Obstetrics (+) Pregnancy                             Anesthesia Physical Anesthesia Plan  ASA: II  Anesthesia Plan: Epidural   Post-op Pain Management:    Induction:   Airway Management Planned:   Additional Equipment:   Intra-op Plan:   Post-operative Plan:   Informed Consent: I have reviewed the patients History and Physical, chart, labs and discussed the procedure including the risks, benefits and alternatives for the proposed anesthesia with the patient or authorized representative who has indicated his/her understanding and acceptance.     Plan Discussed with:   Anesthesia Plan Comments:         Anesthesia Quick Evaluation

## 2016-04-16 NOTE — Progress Notes (Signed)
Patient care turned over to birthing suites RN at this time.

## 2016-04-16 NOTE — Lactation Note (Signed)
This note was copied from a baby's chart. Lactation Consultation Note  Patient Name: Diane [name] QQVZD'GToday's Date: 04/16/2016 Reason for consult: Initial assessment;Difficult latch Baby 10 hours old. Assisted mom to latch baby to right breast, first in cross-cradle and then in football position. Mom's nipples are flat and she is not easily compressible. However, mom is easily expressible. Used hand pump and mom's nipples everted slightly, but baby still not able to latch though she tried eagerly. Fitted mom with a #20 NS and baby able to latch deeply and suckle rhythmically with a few swallows noted. Colostrum present in the NS. Enc mom to put baby to breast with cues and the supplement with EBM using curve-tipped syringe. Enc mom to post-pump after baby supplemented.   Discussed with mom that NS a temporary device and enc mom to continue pumping while using the shield. Also discussed the need to watch baby's weight gain closely while using shield. Mom given inverted shields and enc to wear in her bra between BFs. Discussed assessment and interventions with patient's bedside RN, Raynelle FanningJulie.    Maternal Data Has patient been taught Hand Expression?: Yes Does the patient have breastfeeding experience prior to this delivery?: No  Feeding Feeding Type: Breast Fed Length of feed: 10 min  LATCH Score/Interventions Latch: Repeated attempts needed to sustain latch, nipple held in mouth throughout feeding, stimulation needed to elicit sucking reflex. Intervention(s): Adjust position;Assist with latch;Breast compression  Audible Swallowing: A few with stimulation Intervention(s): Hand expression  Type of Nipple: Flat Intervention(s): Hand pump;Shells;Double electric pump  Comfort (Breast/Nipple): Soft / non-tender     Hold (Positioning): Assistance needed to correctly position infant at breast and maintain latch. Intervention(s): Breastfeeding basics reviewed;Support Pillows;Position  options;Skin to skin  LATCH Score: 6  Lactation Tools Discussed/Used Tools: Pump;Nipple Dorris CarnesShields;Shells Nipple shield size: 20 Breast pump type: Double-Electric Breast Pump WIC Program: Yes Pump Review: Setup, frequency, and cleaning;Milk Storage Initiated by:: Bedside RN Date initiated:: 04/16/16   Consult Status Consult Status: Follow-up Date: 04/17/16 Follow-up type: In-patient    Sherlyn HayJennifer D Saretta Dahlem 04/16/2016, 5:45 PM

## 2016-04-17 LAB — CBC
HEMATOCRIT: 25.3 % — AB (ref 36.0–46.0)
Hemoglobin: 9 g/dL — ABNORMAL LOW (ref 12.0–15.0)
MCH: 30.9 pg (ref 26.0–34.0)
MCHC: 35.6 g/dL (ref 30.0–36.0)
MCV: 86.9 fL (ref 78.0–100.0)
PLATELETS: 184 10*3/uL (ref 150–400)
RBC: 2.91 MIL/uL — ABNORMAL LOW (ref 3.87–5.11)
RDW: 15 % (ref 11.5–15.5)
WBC: 8.8 10*3/uL (ref 4.0–10.5)

## 2016-04-17 LAB — BIRTH TISSUE RECOVERY COLLECTION (PLACENTA DONATION)

## 2016-04-17 MED ORDER — AMLODIPINE BESYLATE 10 MG PO TABS
10.0000 mg | ORAL_TABLET | Freq: Every day | ORAL | Status: DC
  Administered 2016-04-17 – 2016-04-18 (×2): 10 mg via ORAL
  Filled 2016-04-17 (×2): qty 1

## 2016-04-17 NOTE — Progress Notes (Signed)
POSTPARTUM PROGRESS NOTE  Post Partum Day 1 Subjective:  Diane [name] is a 25 y.o. G2P1011 667w1d s/p nsvd.  No acute events overnight.  Pt denies problems with ambulating, voiding or po intake.  She denies nausea or vomiting.  Pain is moderately controlled.  She has had flatus. She has not had bowel movement.  Lochia Small. No ha, vision change, upper abdominal pain, or sob  Objective: Blood pressure (!) 149/95, pulse 83, temperature 98.7 F (37.1 C), temperature source Oral, resp. rate 20, height 5\' 6"  (1.676 m), weight 237 lb 0.1 oz (107.5 kg), last menstrual period 07/31/2015, SpO2 99 %, unknown if currently breastfeeding.  Physical Exam:  General: alert, cooperative and no distress Lochia:normal flow Chest: CTAB Heart: RRR no m/r/g Abdomen: +BS, soft, nontender,  Uterine Fundus: firm,  DVT Evaluation: No calf swelling or tenderness Extremities: pitting edema to knees edema   Recent Labs  04/16/16 0841 04/17/16 0507  HGB 10.6* 9.0*  HCT 30.3* 25.3*    Assessment/Plan:  ASSESSMENT: Diane ServeMorgane Apolinar is a 25 y.o. G2P1011 4067w1d s/p nsvd, c/b preE w/ severe features. S/p 24 hours pp mg. On amlodipine 10 qd. Most rcent bp mild elevation; asymptomatic. Reports lots of urination. Will monitor bp today.  Plan for discharge tomorrow   LOS: 4 days   Silvano Bilisoah B Wouk 04/17/2016, 12:14 PM

## 2016-04-17 NOTE — Lactation Note (Signed)
This note was copied from a baby'[name] chart. Lactation Consultation Note  Patient Name: Diane [name] Reason for consult: Follow-up assessment Mom attempting to put baby to breast.  Baby clothed and sleepy.  I undressed baby and placed skin to skin in football hold. Colostrum hand expressed prior to latch attempt.  Baby is showing feeding cues but cannot sustain latch.  16 mm nipple shield applied and baby latched well and nursed on both breasts for a total of 30 minutes.  Assisted mom with post pumping using the symphony pump.  Instructed to feed with cues, post pump x 15 minutes and supplement with milk pumped.  Encouraged to call with concerns/assist.  Maternal Data    Feeding Feeding Type: Breast Fed Length of feed: 30 min  LATCH Score/Interventions Latch: Grasps breast easily, tongue down, lips flanged, rhythmical sucking. (with nipple shield) Intervention([name]): Skin to skin;Teach feeding cues;Waking techniques Intervention([name]): Adjust position;Assist with latch;Breast massage;Breast compression  Audible Swallowing: A few with stimulation Intervention([name]): Hand expression;Skin to skin Intervention([name]): Skin to skin;Hand expression;Alternate breast massage  Type of Nipple: Everted at rest and after stimulation Intervention([name]): Double electric pump  Comfort (Breast/Nipple): Soft / non-tender     Hold (Positioning): Assistance needed to correctly position infant at breast and maintain latch. Intervention([name]): Breastfeeding basics reviewed;Support Pillows;Position options;Skin to skin  LATCH Score: 8  Lactation Tools Discussed/Used Tools: Nipple Shields Nipple shield size: 16   Consult Status Consult Status: Follow-up Date: 04/18/16    Huston FoleyMOULDEN, Diane [name] [name], 2:14 PM
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
## 2016-04-18 MED ORDER — AMLODIPINE BESYLATE 10 MG PO TABS: 10.0000 mg | ORAL_TABLET | Freq: Every day | ORAL | 1 refills | Status: DC

## 2016-04-18 MED ORDER — IBUPROFEN 600 MG PO TABS: 600.0000 mg | ORAL_TABLET | Freq: Four times a day (QID) | ORAL | 0 refills | Status: DC

## 2016-04-18 NOTE — Lactation Note (Signed)
This note was copied from a baby's chart. Lactation Consultation Note  Patient Name: Diane [name] GNFAO'ZToday's Date: 04/18/2016 Reason for consult: Follow-up assessment;Infant weight loss  Mom states baby is feeding well.  Last feeding was 2 hours ago and baby is sleeping.  Discussed importance of feeding baby every 2 hours using good waking techniques and breast massage due to 8 % weight loss.  Mother's breasts are full and milk easily expressed.  Observed baby latch easily and well.  Baby nursed actively with many swallows.  Baby does have a short lingual frenulum which is not interfering with milk transfer at this point.  Instructed on engorgement treatment and OP services.   Maternal Data    Feeding Feeding Type: Breast Fed Length of feed: 15 min  LATCH Score/Interventions Latch: Grasps breast easily, tongue down, lips flanged, rhythmical sucking. Intervention(s): Skin to skin;Teach feeding cues;Waking techniques Intervention(s): Breast compression;Breast massage;Assist with latch;Adjust position  Audible Swallowing: Spontaneous and intermittent Intervention(s): Hand expression;Skin to skin Intervention(s): Skin to skin;Hand expression;Alternate breast massage  Type of Nipple: Everted at rest and after stimulation  Comfort (Breast/Nipple): Soft / non-tender     Hold (Positioning): No assistance needed to correctly position infant at breast. Intervention(s): Breastfeeding basics reviewed;Support Pillows;Position options;Skin to skin  LATCH Score: 10  Lactation Tools Discussed/Used     Consult Status Consult Status: Complete    Huston FoleyMOULDEN, [name] 04/18/2016, 12:41 PM

## 2016-04-18 NOTE — Discharge Summary (Signed)
OB Discharge Summary     Patient Name: Diane [name] DOB: March 19, 1991 MRN: [id number]  Date of admission: 04/13/2016 Delivering MD: Michaele Offer   Date of discharge: 04/18/2016  Admitting diagnosis: 37 WKS, STOMACH PAIN, VOMITING, BODY SWOLLEN Intrauterine pregnancy: [redacted]w[redacted]d     Secondary diagnosis:  Active Problems:   Preeclampsia, third trimester  Additional problems: None     Discharge diagnosis: Term Pregnancy Delivered and Preeclampsia (severe)                                                                                                Post partum procedures:None  Augmentation: AROM, Pitocin, Cytotec and Foley Balloon  Complications: None  Hospital course:  Induction of Labor With Vaginal Delivery   25 y.o. yo G2P1011 at [redacted]w[redacted]d was admitted to the hospital 04/13/2016 for induction of labor.  Indication for induction: Preeclampsia.  Patient had an uncomplicated labor course as follows: Membrane Rupture Time/Date: 9:27 PM ,04/15/2016   Intrapartum Procedures: Episiotomy: None [1]                                         Lacerations:  3rd degree [4]  Patient had delivery of a Viable infant.  Information for the patient's newborn:  Ytzel, [place] Girl Renna [[id number]]  Delivery Method: Vaginal, Spontaneous Delivery (Filed from Delivery Summary)   04/16/2016  Details of delivery can be found in separate delivery note.  Patient had a routine postpartum course. Patient is discharged home 04/18/16.   Physical exam  Vitals:   04/17/16 1711 04/17/16 2128 04/18/16 0108 04/18/16 0549  BP: (!) 147/97 (!) 146/87 (!) 133/103 138/90  Pulse: 82 88 73 75  Resp: 18 18 18 16   Temp: 98.3 F (36.8 C) 100 F (37.8 C) 98.5 F (36.9 C) 98.3 F (36.8 C)  TempSrc: Oral Oral Oral Oral  SpO2: 100% 100% 98% 97%  Weight:    235 lb (106.6 kg)  Height:       General: alert, cooperative and no distress Lochia: appropriate Uterine Fundus: firm Incision: N/A DVT Evaluation:  Negative Homan's sign. Labs: Lab Results  Component Value Date   WBC 8.8 04/17/2016   HGB 9.0 (L) 04/17/2016   HCT 25.3 (L) 04/17/2016   MCV 86.9 04/17/2016   PLT 184 04/17/2016   CMP Latest Ref Rng & Units 04/13/2016  Glucose 65 - 99 mg/dL 90  BUN 6 - 20 mg/dL 9  Creatinine 0.86 - 5.78 mg/dL 4.69  Sodium 629 - 528 mmol/L 137  Potassium 3.5 - 5.1 mmol/L 3.7  Chloride 101 - 111 mmol/L 105  CO2 22 - 32 mmol/L 24  Calcium 8.9 - 10.3 mg/dL 4.1(L)  Total Protein 6.5 - 8.1 g/dL 6.3(L)  Total Bilirubin 0.3 - 1.2 mg/dL 0.9  Alkaline Phos 38 - 126 U/L 182(H)  AST 15 - 41 U/L 23  ALT 14 - 54 U/L 19    Discharge instruction: per After Visit Summary and "Baby and Me Booklet".  After visit meds:  Medication List    TAKE these medications   acetaminophen 650 MG CR tablet Commonly known as:  TYLENOL Take 1,300 mg by mouth every 8 (eight) hours as needed for pain.   amLODipine 10 MG tablet Commonly known as:  NORVASC Take 1 tablet (10 mg total) by mouth daily.   calcium carbonate 500 MG chewable tablet Commonly known as:  TUMS Chew 1 tablet (200 mg of elemental calcium total) by mouth daily.   ibuprofen 600 MG tablet Commonly known as:  ADVIL,MOTRIN Take 1 tablet (600 mg total) by mouth every 6 (six) hours.   PRENATAL VITAMIN PO Take 1 tablet by mouth daily. gummies   promethazine 12.5 MG tablet Commonly known as:  PHENERGAN Take 2 tablets (25 mg total) by mouth every 6 (six) hours as needed for nausea or vomiting.       Diet: routine diet  Activity: Advance as tolerated. Pelvic rest for 6 weeks.   Outpatient follow up:6 weeks Follow up Appt: Future Appointments Date Time Provider Department Center  05/28/2016 2:40 PM Donette LarryMelanie Bhambri, CNM [facility]   Follow up Visit: Follow-up Information    [facility]   Specialty:  Obstetrics and Gynecology Why:  posptpartum visit Contact information: [address] [place] [id number] [phone]       [facility] .   Why:  as needed in emergencies Contact information: [address] [id number] [place] [phone]          Postpartum contraception: Undecided  Newborn Data: Live born female  Birth Weight: 6 lb 2.1 oz (2780 g) APGAR: 9, 9  Baby Feeding: Breast Disposition:home with mother   04/18/2016 Dorathy KinsmanVirginia Ayza Ripoll, CNM

## 2016-04-18 NOTE — Progress Notes (Signed)
Patient discharged home with infant. Discharge paperwork and instructions reviewed. Prescriptions reviewed. Pre-eclampsia handout given and discussed. No questions at this time.

## 2016-04-18 NOTE — Discharge Instructions (Signed)
Contraception Choices Contraception (birth control) is the use of any methods or devices to prevent pregnancy. Below are some methods to help avoid pregnancy. HORMONAL METHODS   Contraceptive implant. This is a thin, plastic tube containing progesterone hormone. It does not contain estrogen hormone. Your health care provider inserts the tube in the inner part of the upper arm. The tube can remain in place for up to 3 years. After 3 years, the implant must be removed. The implant prevents the ovaries from releasing an egg (ovulation), thickens the cervical mucus to prevent sperm from entering the uterus, and thins the lining of the inside of the uterus.  Progesterone-only injections. These injections are given every 3 months by your health care provider to prevent pregnancy. This synthetic progesterone hormone stops the ovaries from releasing eggs. It also thickens cervical mucus and changes the uterine lining. This makes it harder for sperm to survive in the uterus.  Birth control pills. These pills contain estrogen and progesterone hormone. They work by preventing the ovaries from releasing eggs (ovulation). They also cause the cervical mucus to thicken, preventing the sperm from entering the uterus. Birth control pills are prescribed by a health care provider.Birth control pills can also be used to treat heavy periods.  Minipill. This type of birth control pill contains only the progesterone hormone. They are taken every day of each month and must be prescribed by your health care provider.  Birth control patch. The patch contains hormones similar to those in birth control pills. It must be changed once a week and is prescribed by a health care provider.  Vaginal ring. The ring contains hormones similar to those in birth control pills. It is left in the vagina for 3 weeks, removed for 1 week, and then a new one is put back in place. The patient must be comfortable inserting and removing the ring  from the vagina.A health care provider's prescription is necessary.  Emergency contraception. Emergency contraceptives prevent pregnancy after unprotected sexual intercourse. This pill can be taken right after sex or up to 5 days after unprotected sex. It is most effective the sooner you take the pills after having sexual intercourse. Most emergency contraceptive pills are available without a prescription. Check with your pharmacist. Do not use emergency contraception as your only form of birth control. BARRIER METHODS   Female condom. This is a thin sheath (latex or rubber) that is worn over the penis during sexual intercourse. It can be used with spermicide to increase effectiveness.  Female condom. This is a soft, loose-fitting sheath that is put into the vagina before sexual intercourse.  Diaphragm. This is a soft, latex, dome-shaped barrier that must be fitted by a health care provider. It is inserted into the vagina, along with a spermicidal jelly. It is inserted before intercourse. The diaphragm should be left in the vagina for 6 to 8 hours after intercourse.  Cervical cap. This is a round, soft, latex or plastic cup that fits over the cervix and must be fitted by a health care provider. The cap can be left in place for up to 48 hours after intercourse.  Sponge. This is a soft, circular piece of polyurethane foam. The sponge has spermicide in it. It is inserted into the vagina after wetting it and before sexual intercourse.  Spermicides. These are chemicals that kill or block sperm from entering the cervix and uterus. They come in the form of creams, jellies, suppositories, foam, or tablets. They do not require a  prescription. They are inserted into the vagina with an applicator before having sexual intercourse. The process must be repeated every time you have sexual intercourse. INTRAUTERINE CONTRACEPTION  Intrauterine device (IUD). This is a T-shaped device that is put in a woman's uterus  during a menstrual period to prevent pregnancy. There are 2 types:  Copper IUD. This type of IUD is wrapped in copper wire and is placed inside the uterus. Copper makes the uterus and fallopian tubes produce a fluid that kills sperm. It can stay in place for 10 years.  Hormone IUD. This type of IUD contains the hormone progestin (synthetic progesterone). The hormone thickens the cervical mucus and prevents sperm from entering the uterus, and it also thins the uterine lining to prevent implantation of a fertilized egg. The hormone can weaken or kill the sperm that get into the uterus. It can stay in place for 3-5 years, depending on which type of IUD is used. PERMANENT METHODS OF CONTRACEPTION  Female tubal ligation. This is when the woman's fallopian tubes are surgically sealed, tied, or blocked to prevent the egg from traveling to the uterus.  Hysteroscopic sterilization. This involves placing a small coil or insert into each fallopian tube. Your doctor uses a technique called hysteroscopy to do the procedure. The device causes scar tissue to form. This results in permanent blockage of the fallopian tubes, so the sperm cannot fertilize the egg. It takes about 3 months after the procedure for the tubes to become blocked. You must use another form of birth control for these 3 months.  Female sterilization. This is when the female has the tubes that carry sperm tied off (vasectomy).This blocks sperm from entering the vagina during sexual intercourse. After the procedure, the man can still ejaculate fluid (semen). NATURAL PLANNING METHODS  Natural family planning. This is not having sexual intercourse or using a barrier method (condom, diaphragm, cervical cap) on days the woman could become pregnant.  Calendar method. This is keeping track of the length of each menstrual cycle and identifying when you are fertile.  Ovulation method. This is avoiding sexual intercourse during ovulation.  Symptothermal  method. This is avoiding sexual intercourse during ovulation, using a thermometer and ovulation symptoms.  Post-ovulation method. This is timing sexual intercourse after you have ovulated. Regardless of which type or method of contraception you choose, it is important that you use condoms to protect against the transmission of sexually transmitted infections (STIs). Talk with your health care provider about which form of contraception is most appropriate for you.   This information is not intended to replace advice given to you by your health care provider. Make sure you discuss any questions you have with your health care provider.   Document Released: 06/17/2005 Document Revised: 06/22/2013 Document Reviewed: 12/10/2012 Elsevier Interactive Patient Education 2016 Elsevier Inc.  Vaginal Delivery, Care After Refer to this sheet in the next few weeks. These discharge instructions provide you with information on caring for yourself after delivery. Your health care provider may also give you specific instructions. Your treatment has been planned according to the most current medical practices available, but problems sometimes occur. Call your health care provider if you have any problems or questions after you go home. HOME CARE INSTRUCTIONS  Take over-the-counter or prescription medicines only as directed by your health care provider or pharmacist.  Do not drink alcohol, especially if you are breastfeeding or taking medicine to relieve pain.  Do not chew or smoke tobacco.  Do not use  illegal drugs.  Continue to use good perineal care. Good perineal care includes:  Wiping your perineum from front to back.  Keeping your perineum clean.  Do not use tampons or douche until your health care provider says it is okay.  Shower, wash your hair, and take tub baths as directed by your health care provider.  Wear a well-fitting bra that provides breast support.  Eat healthy foods.  Drink  enough fluids to keep your urine clear or pale yellow.  Eat high-fiber foods such as whole grain cereals and breads, brown rice, beans, and fresh fruits and vegetables every day. These foods may help prevent or relieve constipation.  Follow your health care provider's recommendations regarding resumption of activities such as climbing stairs, driving, lifting, exercising, or traveling.  Talk to your health care provider about resuming sexual activities. Resumption of sexual activities is dependent upon your risk of infection, your rate of healing, and your comfort and desire to resume sexual activity.  Try to have someone help you with your household activities and your newborn for at least a few days after you leave the hospital.  Rest as much as possible. Try to rest or take a nap when your newborn is sleeping.  Increase your activities gradually.  Keep all of your scheduled postpartum appointments. It is very important to keep your scheduled follow-up appointments. At these appointments, your health care provider will be checking to make sure that you are healing physically and emotionally. SEEK MEDICAL CARE IF:   You are passing large clots from your vagina. Save any clots to show your health care provider.  You have a foul smelling discharge from your vagina.  You have trouble urinating.  You are urinating frequently.  You have pain when you urinate.  You have a change in your bowel movements.  You have increasing redness, pain, or swelling near your vaginal incision (episiotomy) or vaginal tear.  You have pus draining from your episiotomy or vaginal tear.  Your episiotomy or vaginal tear is separating.  You have painful, hard, or reddened breasts.  You have a severe headache.  You have blurred vision or see spots.  You feel sad or depressed.  You have thoughts of hurting yourself or your newborn.  You have questions about your care, the care of your newborn, or  medicines.  You are dizzy or light-headed.  You have a rash.  You have nausea or vomiting.  You were breastfeeding and have not had a menstrual period within 12 weeks after you stopped breastfeeding.  You are not breastfeeding and have not had a menstrual period by the 12th week after delivery.  You have a fever. SEEK IMMEDIATE MEDICAL CARE IF:   You have persistent pain.  You have chest pain.  You have shortness of breath.  You faint.  You have leg pain.  You have stomach pain.  Your vaginal bleeding saturates two or more sanitary pads in 1 hour.   This information is not intended to replace advice given to you by your health care provider. Make sure you discuss any questions you have with your health care provider.   Document Released: 06/14/2000 Document Revised: 03/08/2015 Document Reviewed: 02/12/2012 Elsevier Interactive Patient Education [employer]2016 Elsevier Inc.

## 2016-04-19 ENCOUNTER — Encounter: Payer: Self-pay | Admitting: Obstetrics and Gynecology

## 2016-04-26 ENCOUNTER — Encounter: Payer: Self-pay | Admitting: Family Medicine

## 2016-05-28 ENCOUNTER — Ambulatory Visit (INDEPENDENT_AMBULATORY_CARE_PROVIDER_SITE_OTHER): Payer: Medicaid Other | Admitting: Certified Nurse Midwife

## 2016-05-28 ENCOUNTER — Encounter: Payer: Self-pay | Admitting: Certified Nurse Midwife

## 2016-05-28 LAB — POCT URINALYSIS DIP (DEVICE)
BILIRUBIN URINE: NEGATIVE
Glucose, UA: NEGATIVE mg/dL
HGB URINE DIPSTICK: NEGATIVE
KETONES UR: NEGATIVE mg/dL
Leukocytes, UA: NEGATIVE
Nitrite: NEGATIVE
PH: 6 (ref 5.0–8.0)
Protein, ur: NEGATIVE mg/dL
SPECIFIC GRAVITY, URINE: 1.01 (ref 1.005–1.030)
Urobilinogen, UA: 0.2 mg/dL (ref 0.0–1.0)

## 2016-05-28 NOTE — Progress Notes (Signed)
Subjective:     Diane Diane [name] is a 25 y.o. female who presents for a postpartum visit. She is 6 weeks postpartum following a spontaneous vaginal delivery. I have fully reviewed the prenatal and intrapartum course. The delivery was at 38 gestational weeks. Outcome: spontaneous vaginal delivery. Anesthesia: epidural. Postpartum course has been unremarkable. Baby's course has been unremarkable. Baby is feeding by breast. Bleeding no bleeding. Bowel function is normal, denies incontinence. Bladder function is normal. Patient is not sexually active. Contraception method is none today, she is considering OCP. Postpartum depression screening: negative.  The following portions of the patient's history were reviewed and updated as appropriate: allergies, current medications, past family history, past medical history, past social history, past surgical history and problem list.  Review of Systems Pertinent items are noted in HPI.   Objective:    BP 122/71   Pulse 74   Wt 197 lb 14.4 oz (89.8 kg)   LMP 05/14/2016 (Within Days)   Breastfeeding? Yes   BMI 31.94 kg/m   General:  alert, cooperative and no distress   Breasts:    Lungs:  norma rate and effort  Heart:  Regular rate  Abdomen:    Vulva:  normal  Vagina: normal vagina and laceration well healed and approximated  Cervix:    Corpus:   Adnexa:    Rectal Exam:        Neuro: grossly intact Assessment:     Normal postpartum exam.  Pap smear not done at today's visit.  Preeclampsia, delivered- resolved Plan:    1. Contraception: undecided today, will call back with decision, plan on abstinance for now 2. Follow up in: prn

## 2016-06-03 ENCOUNTER — Telehealth (HOSPITAL_COMMUNITY): Payer: Self-pay | Admitting: Lactation Services

## 2016-06-03 NOTE — Telephone Encounter (Addendum)
Mom called , left message and I called her back. She states she took the baby to [facility] on Saturday because she was coughing. Mom told the doctor she was breast feeding and it hurt very much. The doctor told her to call us and make an appointment. Mom thinks her tongue can not come out all the way and maybe that is why it is hurting. She is not bottle feeding at all, only BF even though it hurts a lot. OP appointment made for Tuesday 12/5 at 4 pm. No further questions at present.

## 2016-06-04 ENCOUNTER — Ambulatory Visit (HOSPITAL_COMMUNITY)
Admission: RE | Admit: 2016-06-04 | Discharge: 2016-06-04 | Disposition: A | Discharge status: Home / Self Care | Payer: Medicaid Other | Source: Ambulatory Visit | Attending: Obstetrics & Gynecology | Admitting: Obstetrics & Gynecology

## 2016-06-04 DIAGNOSIS — Z029 Encounter for administrative examinations, unspecified: Secondary | ICD-10-CM | POA: Diagnosis not present

## 2016-06-04 NOTE — Lactation Note (Signed)
Lactation Consult for [employer] (mother) & Diane [name] (DOB: [birth year])  Mother's reason for visit: "pain in nipple" Consult:  Initial Lactation Consultant:  Remigio Eisenmengerichey, Onnie Hatchel Hamilton  ________________________________________________________________________ BW: 6# 2.1oz 04-19-16: 5# 8oz 04-22-16: 6# 1oz 04-23-16: 6# 2.6oz  06-01-16: 10# 15oz Today's weight: 11# 4.1oz  Mother's Name: Lener Newborn Type of delivery:  Vag Breastfeeding Experience: primip Maternal Medical Conditions:  None Maternal Medications: PNV  ________________________________________________________________________  Breastfeeding History (Post Discharge)  Frequency of breastfeeding: "I don't count, but everytime she cry" Duration of feeding: 5-10 minutes  Pumping  Type of pump:  Harmony Frequency: rarely pumps Volume: a few ounces/session  Bottle feeding   Infant Intake and Output Assessment  Voids: 8-10 in 24 hrs.  Color:  Clear yellow Stools: once or bid in 24 hrs.  Color:  Yellow  ________________________________________________________________________  Maternal Breast Assessment  Breast:  Full. R breast very full Nipple:  Erect and Cracked   _______________________________________________________________________ Feeding Assessment/Evaluation  Initial feeding assessment:  Infant's oral assessment:  Variance (see below)  Attached assessment:  Deep  Lips flanged:  Yes.      Suck assessment:  Nutritive  Pre-feed weight: 5106g Post-feed weight: 5220 g  Amount transferred: 114 ml R breast, 8 min  Total amount transferred: 114 ml  Mom presented b/c of nipple pain that has persisted "since day one." Mom (and MD) suspects ankyloglossia. A thin, anterior frenulum was easy to visualize when infant cried. On lateralization, there is a pronounced divet when infant tries to move her tongue to the side of her mouth. This has not affected her weight gain, as mother has an abundant supply  and the infant transfers with ease (114mL in 8 minutes). Elmarie Shileyiffany is 497 weeks old & is 5# above BW. However, Mom had discomfort through the majority of the feeding, although it did ease somewhat when I placed Mom in a laid-back position.   Bilaterally, Mom's nipples are mildly cracked at the base and on the nipple shafts. No bleeding or exudate noted. Mom reports that she sometimes hurts so bad that she would prefer to just pump & BO. In light of Mom's discomfort level, I think it would be prudent to have a frenotomy performed. Based on its location, a simple frenotomy is probably all that is needed. Mom reports that when she was born she had a tongue-tie, which was clipped shortly after her birth.    Plan 1. Try to nurse with mother in laid-back position. 2. Comfort Gels provided w/instructions for use.   Mother is also concerned about infant's umbilical hernia, which she says she has gotten larger. Mom would like the plan of care for addressing the infant's umbilical hernia to be better defined.   Glenetta HewKim Zriyah Kopplin, RN, IBCLC

## 2016-07-23 ENCOUNTER — Telehealth: Payer: Self-pay | Admitting: *Deleted

## 2016-07-23 NOTE — Telephone Encounter (Addendum)
Pt left message stating that birth control was discussed at her last visit and she was told to call the nurse if she changed her mind. She wants to start birth control pills and was told she needs pregnancy test first. She wants to schedule the test and then receive Rx for birth control pills.   1/24  1045  I called pt and discussed her concerns.  She states that she had discussed birth control @ her PP visit and was undecided at the time. She has decided that she wants birth control pills because she is breast feeding and plans to get pregnant again in about a year. She has not been sexually active since the birth of her baby. I advised pt that she may come in for pregnancy test and if negative, we will prescribe the pills.  Pt voiced understanding and will come in today @ 1430 for UPT.

## 2016-07-24 ENCOUNTER — Ambulatory Visit (INDEPENDENT_AMBULATORY_CARE_PROVIDER_SITE_OTHER): Payer: Medicaid Other | Admitting: *Deleted

## 2016-07-24 DIAGNOSIS — Z30011 Encounter for initial prescription of contraceptive pills: Secondary | ICD-10-CM

## 2016-07-24 DIAGNOSIS — Z3202 Encounter for pregnancy test, result negative: Secondary | ICD-10-CM | POA: Diagnosis not present

## 2016-07-24 LAB — POCT PREGNANCY, URINE: Preg Test, Ur: NEGATIVE

## 2016-07-24 MED ORDER — NORETHINDRONE 0.35 MG PO TABS: 1.0000 | ORAL_TABLET | Freq: Every day | ORAL | 11 refills | Status: DC

## 2016-07-24 NOTE — Progress Notes (Signed)
Consult with Dr. Macon LargeAnyanwu for Rx order. Pt advised to use condoms for the first 2 weeks of the first pill pack. If she stops breast feeding, she should notify our office as a more effective birth control pill will be prescribed at that time.  Pt voiced understanding.

## 2016-08-01 ENCOUNTER — Ambulatory Visit: Payer: Self-pay | Admitting: Family Medicine

## 2017-05-03 IMAGING — US US OB TRANSVAGINAL
1 series · 15 of 28 positions shown · non-contrast
Comparison: None.

CLINICAL DATA: Right flank pain for 4 days, early pregnancy.
Menstrual dating is 5 weeks 1 day with estimated date of confinement
May 06, 2016

EXAM:
OBSTETRIC <14 WK US AND TRANSVAGINAL OB US
TECHNIQUE: Both transabdominal and transvaginal ultrasound examinations were
performed for complete evaluation of the gestation as well as the
maternal uterus, adnexal regions, and pelvic cul-de-sac.
Transvaginal technique was performed to assess early pregnancy.

[Series 1: us ob transvaginal · 78 acquisitions, 15 frames shown]
[im 1/78]
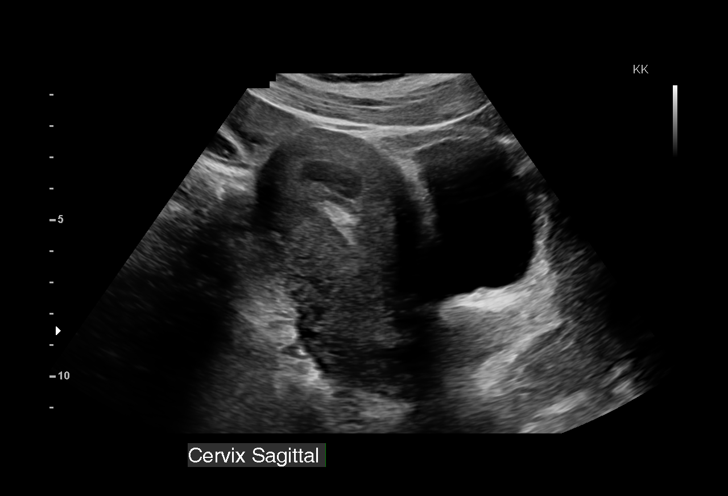
[im 6/78]
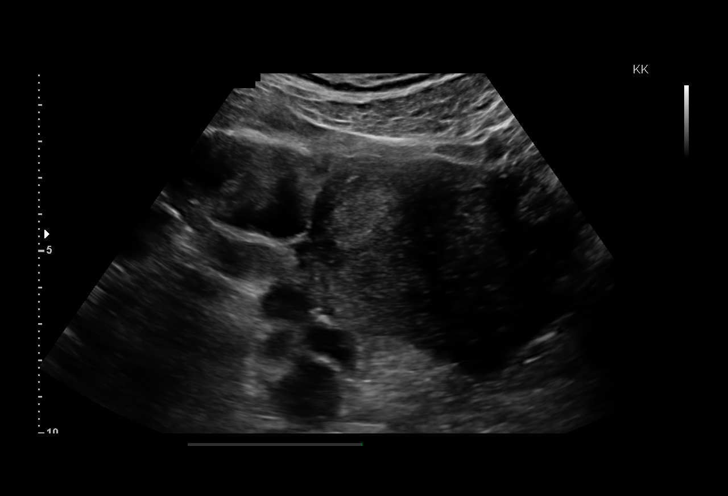
[im 12/78]
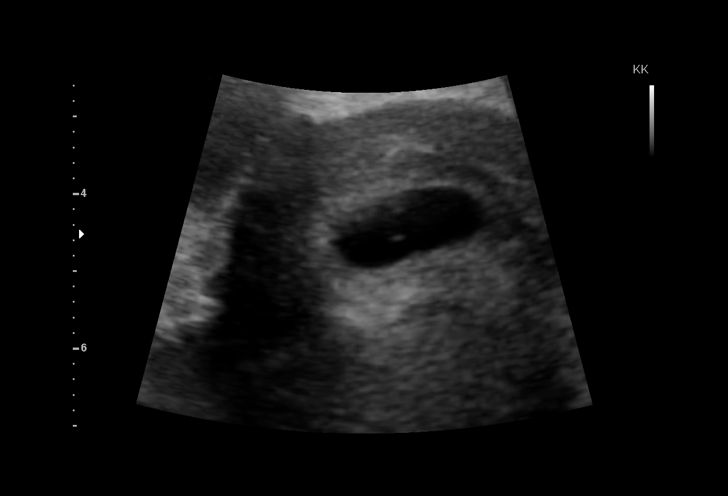
[im 18/78]
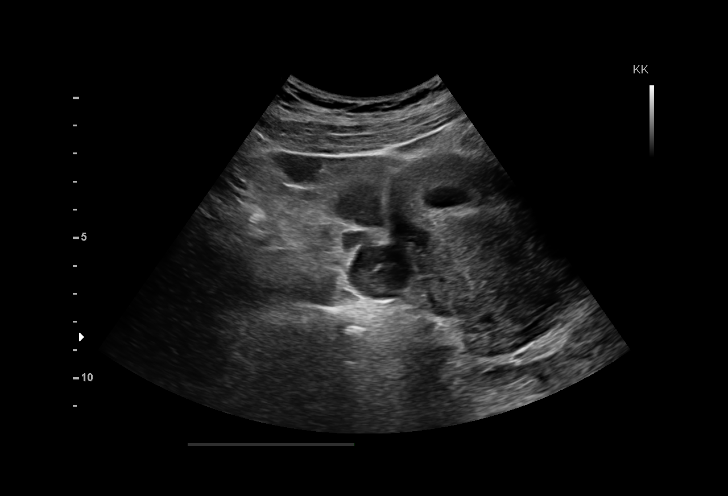
[im 23/78]
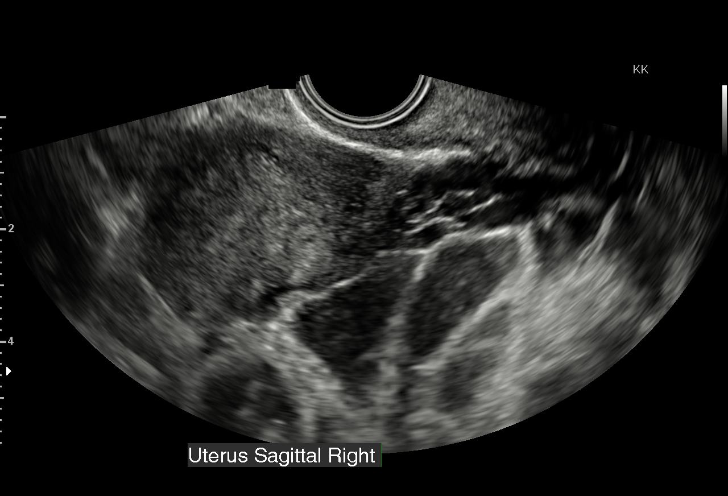
[im 29/78]
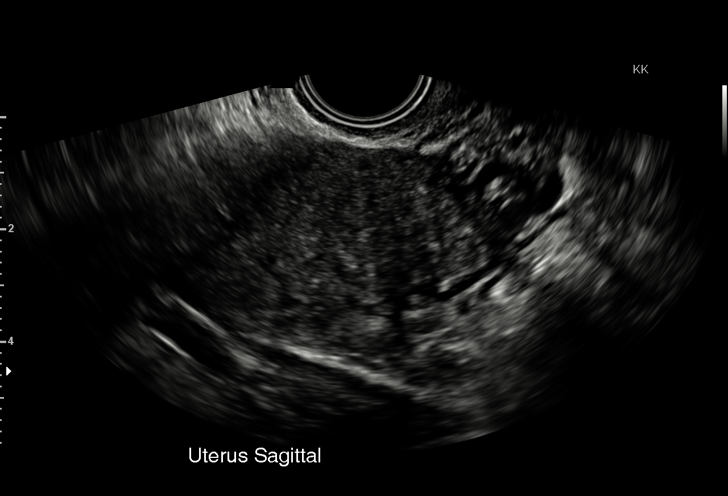
[im 35/78]
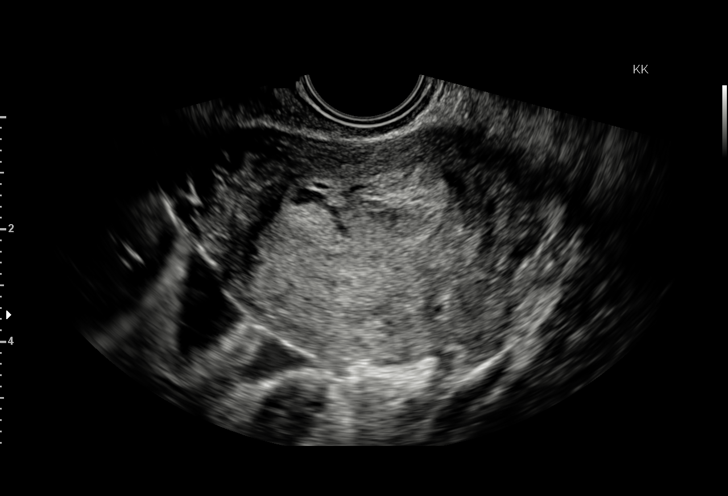
[im 40/78]
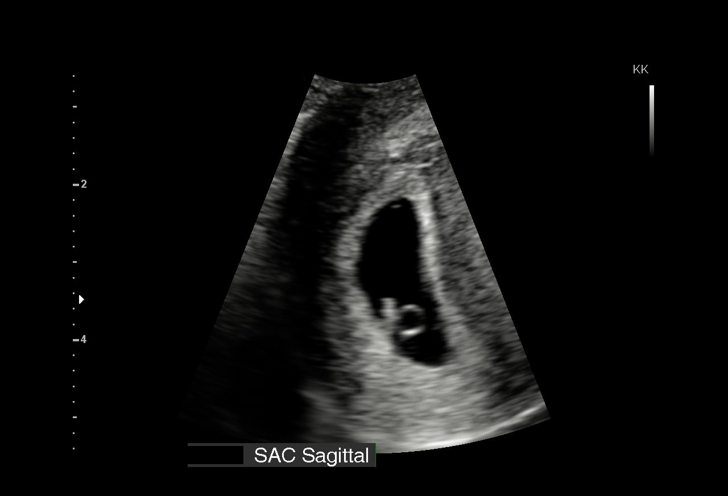
[im 43/78]
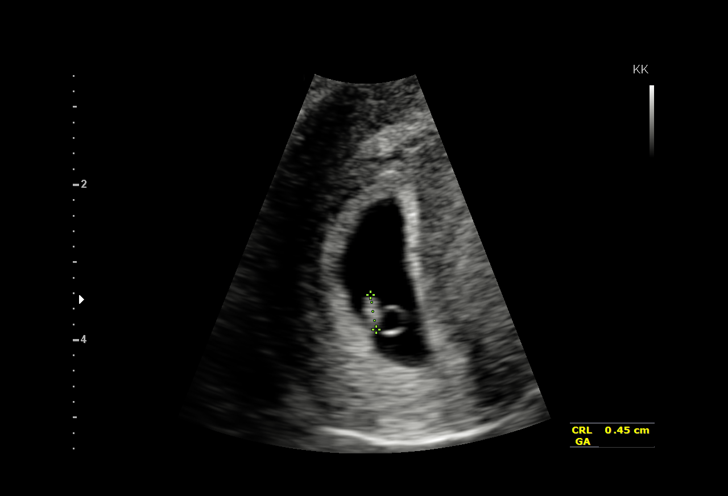
[im 49/78]
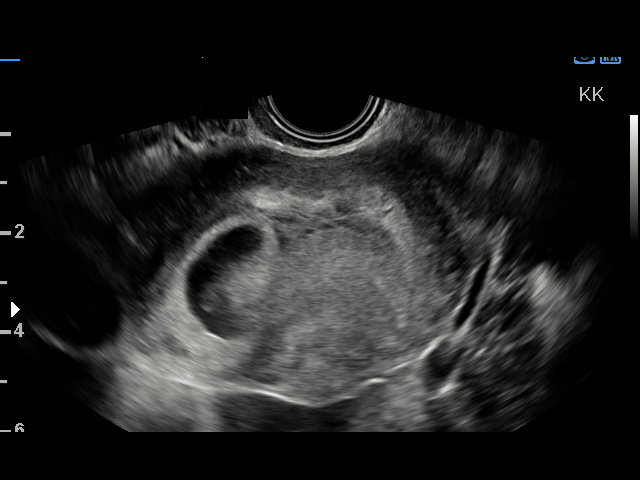
[im 55/78]
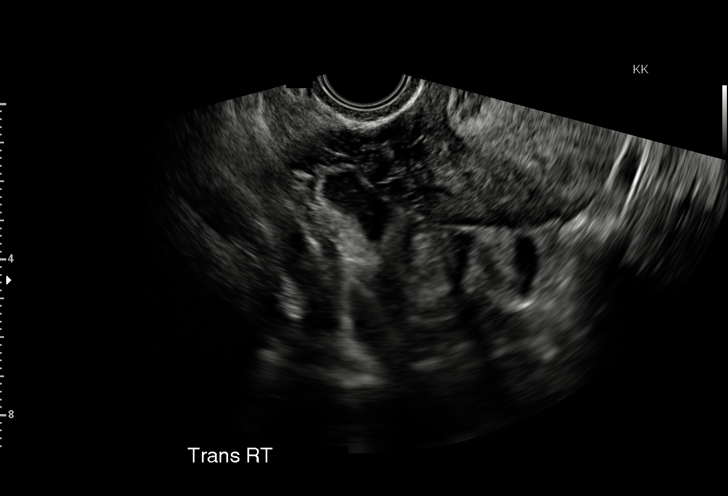
[im 60/78]
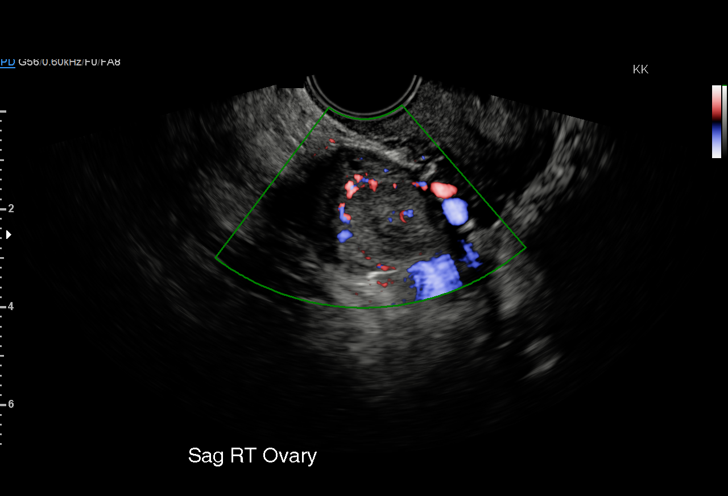
[im 66/78]
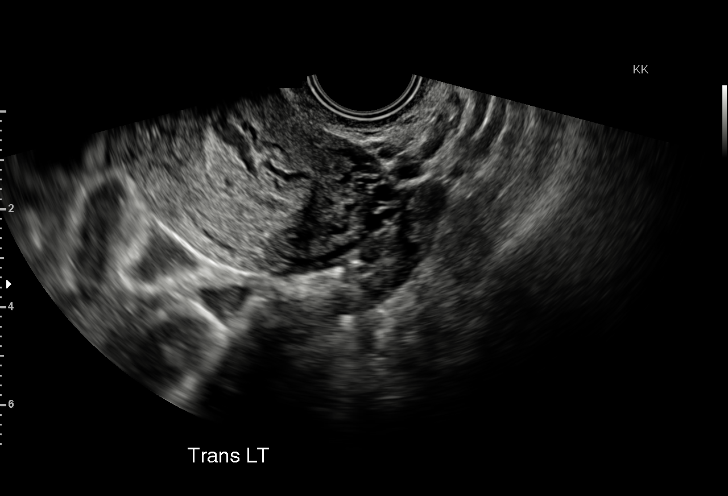
[im 72/78]
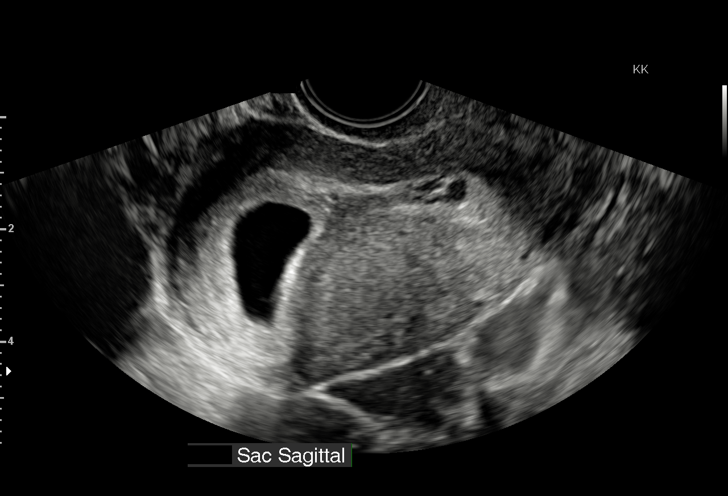
[im 78/78]
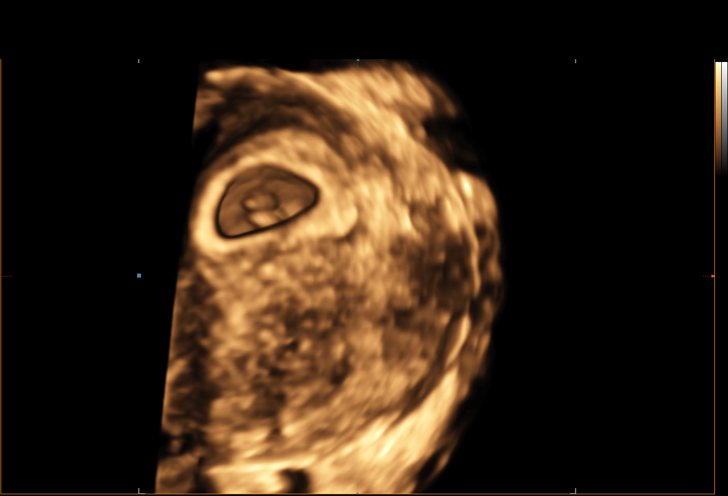

[15 of 28 positions shown; findings below may reference images not displayed]

FINDINGS: Intrauterine gestational sac: Present common normal in shape. It is
located in the right cornu of the endometrial cavity.

Yolk sac:  Present

Embryo:  Present

Cardiac Activity: Present

Heart Rate: 114.  Bpm

CRL: 0.45 cm mm 6 w 1 d US EDC: April 29, 2016.

Subchorionic hemorrhage:  None visualized.

Maternal uterus/adnexae: Normal.
IMPRESSION: Viable IUP with estimated gestational age of 6 weeks 1 day and
estimated date of confinement April 29, 2016. The location and
of the gestational sac is in the right cornu. There is no
subchorionic hemorrhage. The maternal ovaries are unremarkable.

## 2017-06-17 IMAGING — US US MFM FETAL NUCHAL TRANSLUCENCY
1 series · 15 of 16 positions shown · non-contrast
Comparison: none

[Series 1: us mfm fetal nuchal translucency · 15 of 16 slices shown]
[im 1/16]
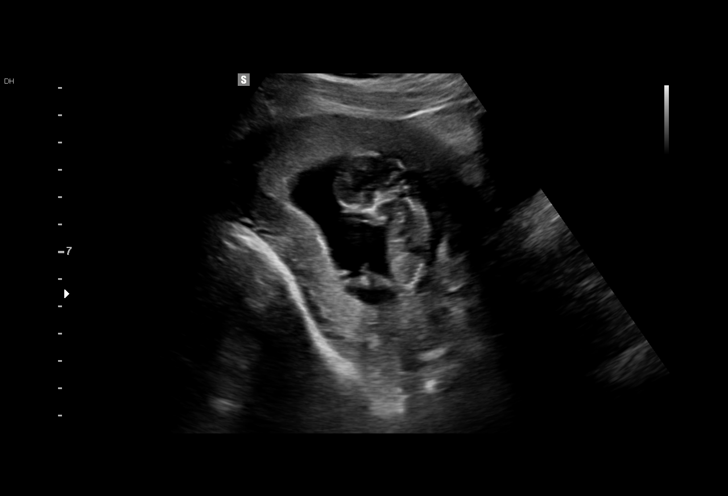
[im 2/16]
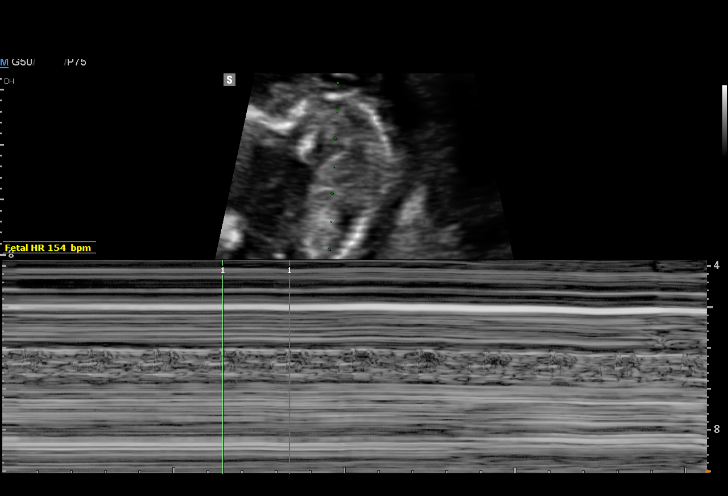
[im 3/16]
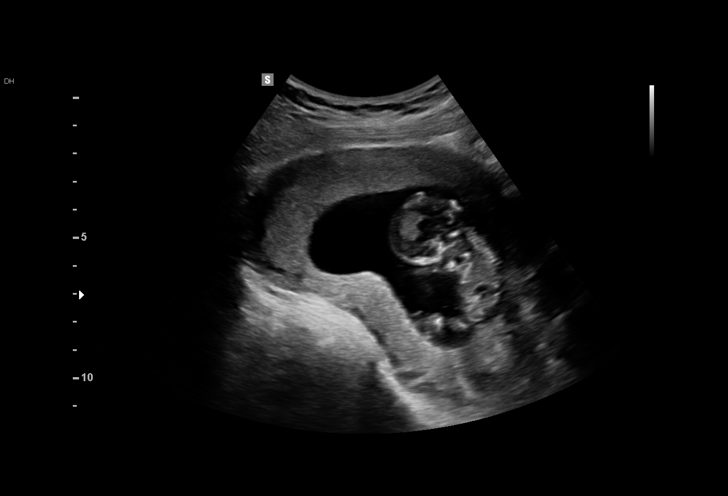
[im 4/16]
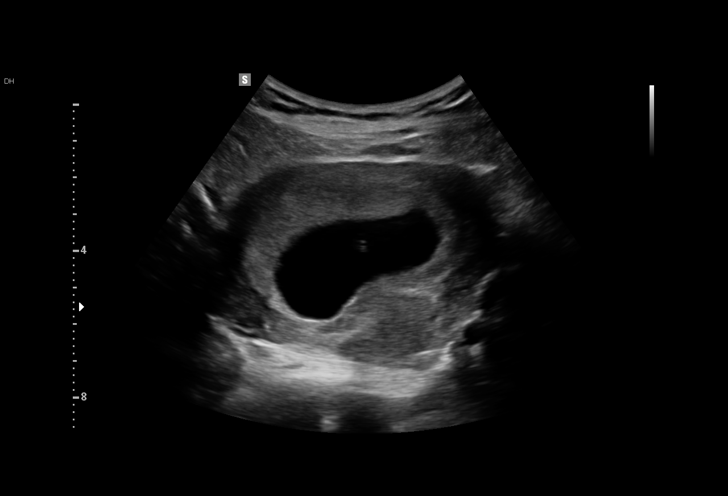
[im 5/16]
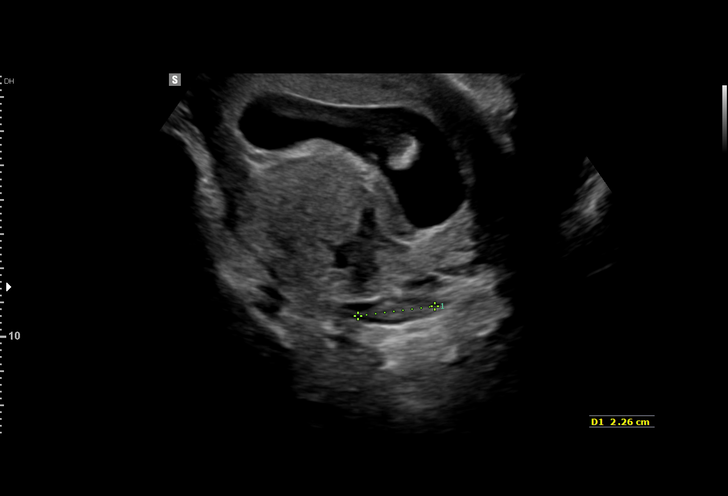
[im 6/16]
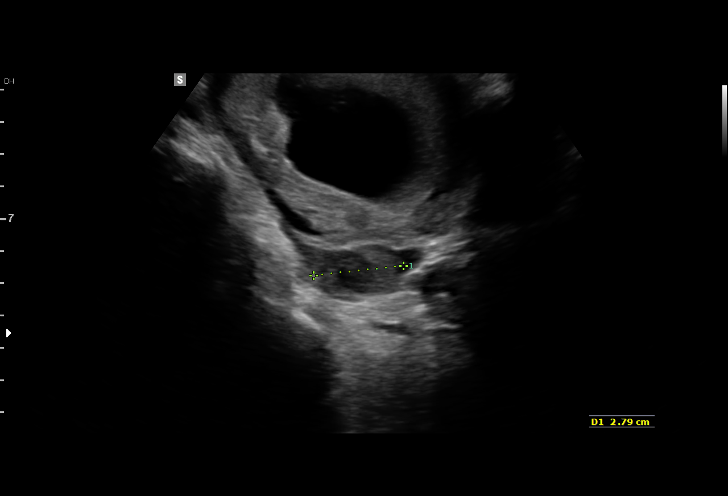
[im 7/16]
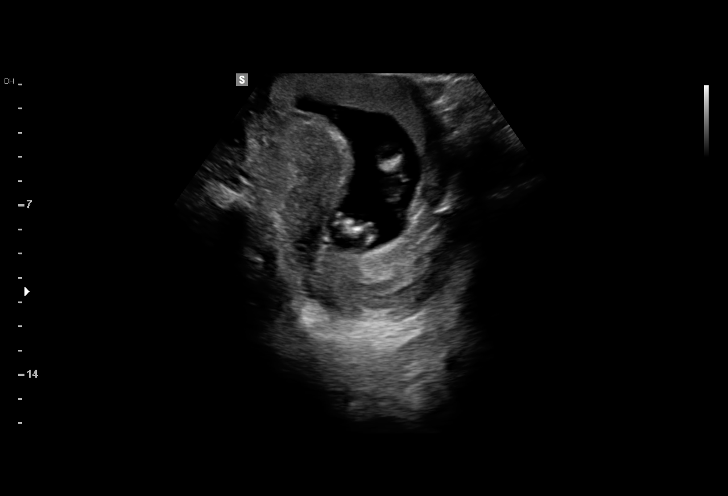
[im 9/16]
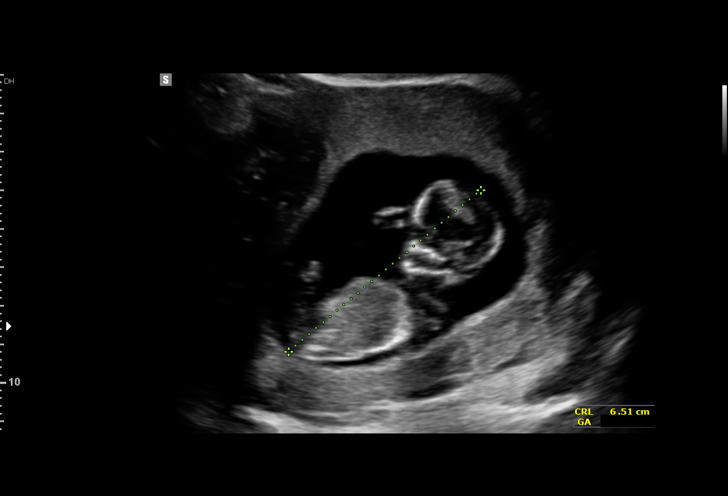
[im 10/16]
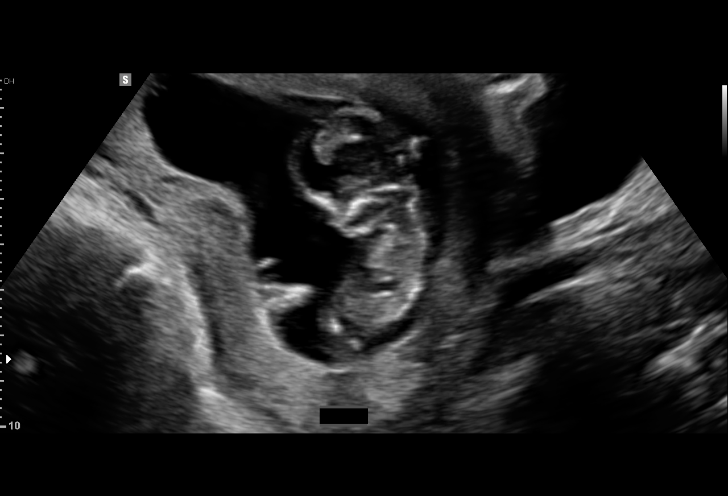
[im 11/16]
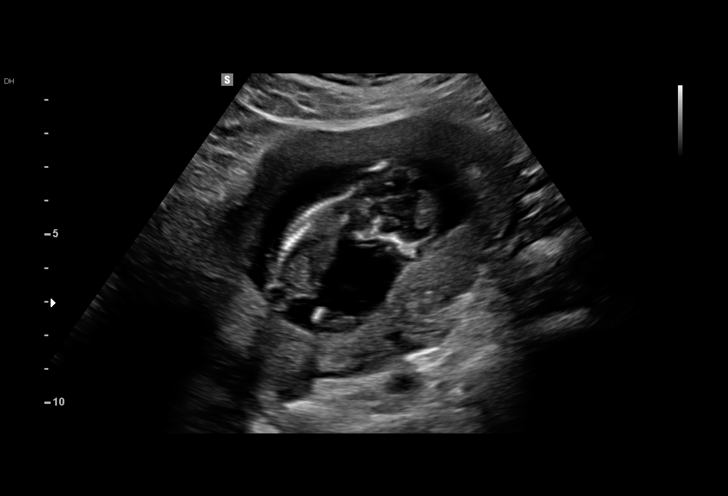
[im 12/16]
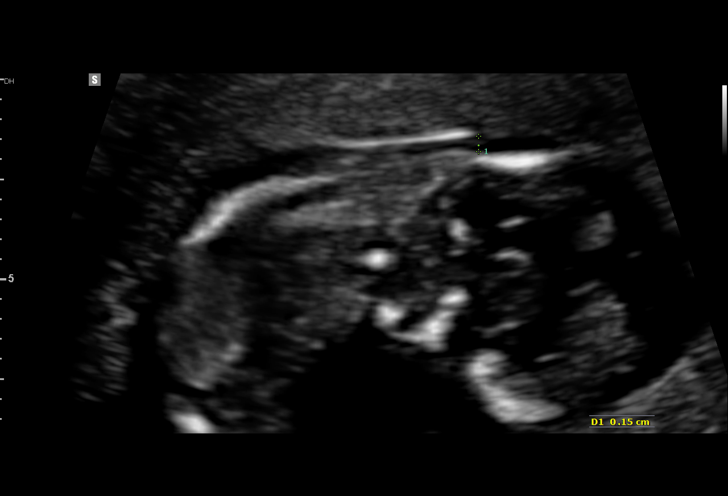
[im 13/16]
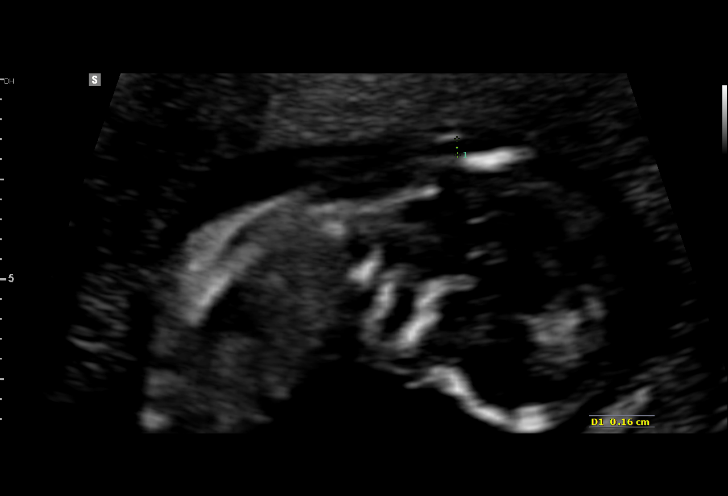
[im 14/16]
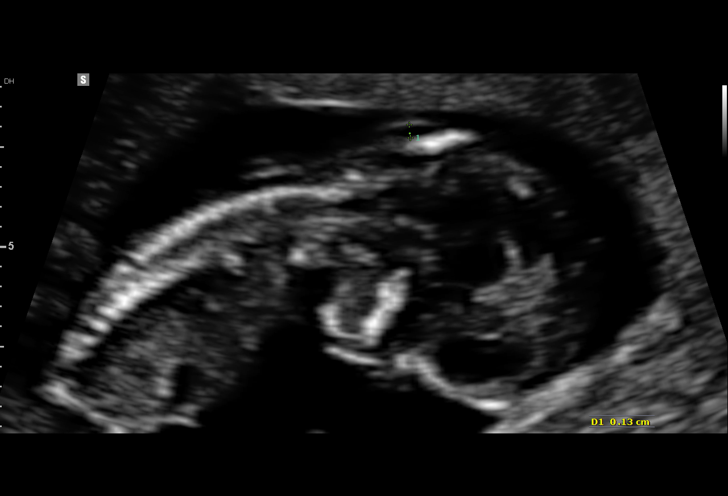
[im 15/16]
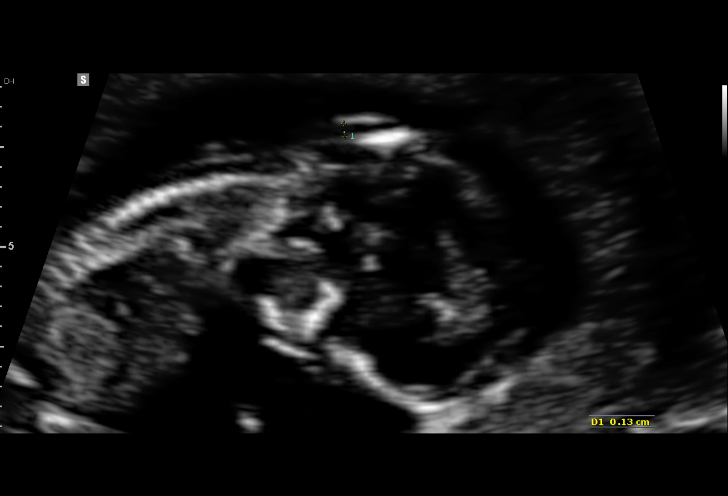
[im 16/16]
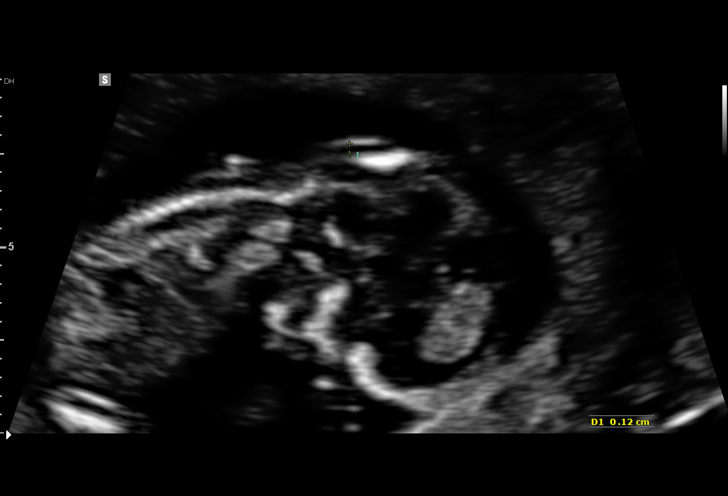

[15 of 16 positions shown; findings below may reference images not displayed]

[facility] Clinic
[REDACTED]-
Faculty Physician

TRANSLUCENCY

1  MARTIJAR BHAGOL             [id number]      [id number]     [id number]
Indications

12 weeks gestation of pregnancy
First trimester aneuploidy screen (NT)         Z36
OB History

Gravidity:    2         Term:   0        Prem:   0        SAB:   1
TOP:          0       Ectopic:  0        Living: 0
Fetal Evaluation

Num Of Fetuses:     1
Fetal Heart         154
Rate(bpm):
Cardiac Activity:   Observed
Presentation:       Breech
Placenta:           Fundal

Amniotic Fluid
AFI FV:      Subjectively within normal limits
Gestational Age

LMP:           11w 4d       Date:   07/31/15                 EDD:   05/06/16
Best:          12w 4d    Det. By:   Early Ultrasound         EDD:   04/29/16
(09/05/15)
1st Trimester Genetic Sonogram Screening

CRL:            65.1  mm    G. Age:   12w 5d                 EDD:   04/28/16
Nuc Trans:       1.6  mm
Nasal Bone:                 Not well visualized
Cervix Uterus Adnexa

Cervix
Normal appearance by transabdominal scan.

Left Ovary
Within normal limits.

Right Ovary
Within normal limits.

Adnexa:       No abnormality visualized.
Impression

Single IUP at 12w 4d
Normal NT (1.6 mm).  Nasal bone not seen due to fetal
position
First trimester aneuploidy screen performed as noted above.
Please do not draw triple/quad screen, though patient should
be offered MSAFP for neural tube defect screening.
Recommendations

Recommend ultrasound for fetal anatomy at 18-20 weeks

## 2017-10-14 ENCOUNTER — Encounter: Payer: Self-pay | Admitting: *Deleted

## 2018-08-02 ENCOUNTER — Other Ambulatory Visit: Payer: Self-pay

## 2018-08-02 ENCOUNTER — Encounter (HOSPITAL_COMMUNITY): Payer: Self-pay

## 2018-08-02 ENCOUNTER — Inpatient Hospital Stay (HOSPITAL_COMMUNITY)
Admission: AD | Admit: 2018-08-02 | Discharge: 2018-08-02 | Disposition: A | Discharge status: Home / Self Care | Source: Ambulatory Visit | Attending: Obstetrics & Gynecology | Admitting: Obstetrics & Gynecology

## 2018-08-02 DIAGNOSIS — O09299 Supervision of pregnancy with other poor reproductive or obstetric history, unspecified trimester: Secondary | ICD-10-CM | POA: Diagnosis not present

## 2018-08-02 DIAGNOSIS — R51 Headache: Secondary | ICD-10-CM | POA: Insufficient documentation

## 2018-08-02 DIAGNOSIS — O1203 Gestational edema, third trimester: Secondary | ICD-10-CM | POA: Insufficient documentation

## 2018-08-02 DIAGNOSIS — N949 Unspecified condition associated with female genital organs and menstrual cycle: Secondary | ICD-10-CM | POA: Diagnosis not present

## 2018-08-02 DIAGNOSIS — O2693 Pregnancy related conditions, unspecified, third trimester: Secondary | ICD-10-CM | POA: Diagnosis not present

## 2018-08-02 DIAGNOSIS — R1030 Lower abdominal pain, unspecified: Secondary | ICD-10-CM | POA: Insufficient documentation

## 2018-08-02 DIAGNOSIS — O26893 Other specified pregnancy related conditions, third trimester: Secondary | ICD-10-CM | POA: Insufficient documentation

## 2018-08-02 DIAGNOSIS — Z3A29 29 weeks gestation of pregnancy: Secondary | ICD-10-CM

## 2018-08-02 HISTORY — DX: Gestational (pregnancy-induced) hypertension without significant proteinuria, unspecified trimester: O13.9

## 2018-08-02 LAB — COMPREHENSIVE METABOLIC PANEL
ALBUMIN: 2.8 g/dL — AB (ref 3.5–5.0)
ALK PHOS: 95 U/L (ref 38–126)
ALT: 17 U/L (ref 0–44)
ANION GAP: 6 (ref 5–15)
AST: 15 U/L (ref 15–41)
BUN: 6 mg/dL (ref 6–20)
CO2: 22 mmol/L (ref 22–32)
Calcium: 8.4 mg/dL — ABNORMAL LOW (ref 8.9–10.3)
Chloride: 107 mmol/L (ref 98–111)
Creatinine, Ser: 0.5 mg/dL (ref 0.44–1.00)
GFR calc Af Amer: >60 mL/min (ref >60)
GFR calc non Af Amer: >60 mL/min (ref >60)
GLUCOSE: 94 mg/dL (ref 70–99)
POTASSIUM: 3.5 mmol/L (ref 3.5–5.1)
SODIUM: 135 mmol/L (ref 135–145)
TOTAL PROTEIN: 6.3 g/dL — AB (ref 6.5–8.1)
Total Bilirubin: 0.8 mg/dL (ref 0.3–1.2)

## 2018-08-02 LAB — URINALYSIS, ROUTINE W REFLEX MICROSCOPIC
BILIRUBIN URINE: NEGATIVE
Glucose, UA: NEGATIVE mg/dL
Hgb urine dipstick: NEGATIVE
KETONES UR: NEGATIVE mg/dL
NITRITE: NEGATIVE
Protein, ur: NEGATIVE mg/dL
SPECIFIC GRAVITY, URINE: 1.015 (ref 1.005–1.030)
pH: 5.5 (ref 5.0–8.0)

## 2018-08-02 LAB — PROTEIN / CREATININE RATIO, URINE
Creatinine, Urine: 59 mg/dL
Protein Creatinine Ratio: 0.15 mg/mg{Cre} (ref 0.00–0.15)
Total Protein, Urine: 9 mg/dL

## 2018-08-02 LAB — URINALYSIS, MICROSCOPIC (REFLEX)

## 2018-08-02 LAB — CBC
HEMATOCRIT: 31.8 % — AB (ref 36.0–46.0)
HEMOGLOBIN: 11.1 g/dL — AB (ref 12.0–15.0)
MCH: 32.4 pg (ref 26.0–34.0)
MCHC: 34.9 g/dL (ref 30.0–36.0)
MCV: 92.7 fL (ref 80.0–100.0)
Platelets: 176 10*3/uL (ref 150–400)
RBC: 3.43 MIL/uL — ABNORMAL LOW (ref 3.87–5.11)
RDW: 13.3 % (ref 11.5–15.5)
WBC: 7.9 10*3/uL (ref 4.0–10.5)
nRBC: 0 % (ref 0.0–0.2)

## 2018-08-02 LAB — POCT PREGNANCY, URINE: Preg Test, Ur: POSITIVE — AB

## 2018-08-02 NOTE — MAU Note (Signed)
C/O contractions since Friday. Had headache yesterday.  Pain in lower abdomen.

## 2018-08-02 NOTE — MAU Provider Note (Signed)
History    CSN: [id number] Arrival date and time: 08/02/18 1443 First Provider Initiated Contact with Patient 08/02/18 1533    Chief Complaint  Patient presents with  . Contractions  . Headache   HPI  28yo G3P1011 at [redacted]w[redacted]d who presents with contractions for last three days, pain in lower abdomen. Also had a headache yesterday. Here today with her friends from church. States two days ago she felt poorly, almost felt like she may go into labor. States was having nausea, vomiting and contractions. Didn't take any medicines for headache. Yesterday she had continued headache, poor appetite and abdominal pain. Today she woke up and her headache was gone. She went to church and her friends noticed she was much more swollen than a week ago. She reports having contractions every few minutes, causes pain in lower left side. Was worried it might be preeclampsia, had that in her first pregnancy.   OB History    Gravida  3   Para  1   Term  1   Preterm      AB  1   Living  1     SAB  1   TAB      Ectopic      Multiple  0   Live Births  1           Past Medical History:  Diagnosis Date  . Dysmenorrhea   . Medical history non-contributory   . Pregnancy induced hypertension     Past Surgical History:  Procedure Laterality Date  . HERNIA REPAIR  2011   umbilical     Family History  Problem Relation Age of Onset  . Hypertension Mother     Social History   Tobacco Use  . Smoking status: Never Smoker  . Smokeless tobacco: Never Used  Substance Use Topics  . Alcohol use: No    Alcohol/week: 0.0 standard drinks  . Drug use: No    Comment: Discussed birth control options    Allergies:  Allergies  Allergen Reactions  . Coconut Flavor Rash  . Fish Allergy Rash    "Mackeral" only  . Pineapple Rash    No medications prior to admission.    Review of Systems  Constitutional: Positive for activity change, appetite change and fatigue. Negative for chills and  diaphoresis.  Eyes: Negative for visual disturbance.  Respiratory: Negative for shortness of breath.   Cardiovascular: Positive for leg swelling (also arm swelling, face). Negative for chest pain.  Gastrointestinal: Positive for abdominal pain and nausea. Negative for vomiting.  Genitourinary: Negative for dysuria, vaginal bleeding and vaginal discharge.  Musculoskeletal: Negative for back pain.  Neurological: Negative for dizziness, light-headedness and headaches.  Psychiatric/Behavioral: The patient is not nervous/anxious.    Physical Exam   Blood pressure 123/74, pulse 100, temperature 98.9 F (37.2 C), resp. rate 18, height 5\' 7"  (1.702 m), weight 98.9 kg, SpO2 100 %, currently breastfeeding.  Physical Exam  Nursing note and vitals reviewed. Constitutional: She is oriented to person, place, and time. She appears well-developed and well-nourished. No distress.  HENT:  Head: Normocephalic and atraumatic.  Eyes: Conjunctivae and EOM are normal. No scleral icterus.  Cardiovascular: Normal rate.  Respiratory: No respiratory distress.  GI: Soft. There is no abdominal tenderness. There is no guarding.  gravid  Genitourinary:    Genitourinary Comments: SVE: closed/thick/posterior   Musculoskeletal:        General: Edema (trace) present.  Neurological: She is alert and oriented to person, place, and  time.  Psychiatric: She has a normal mood and affect. Her behavior is normal.    MAU Course  Procedures  MDM -- history of preE, reports HA and swelling last few days, normal BP here - will check UPC, CMP, CBC and continue to cycle Bps -- CMP, CBC wnl, UPC still in process, u/a wnl  -- BP continues to be in normal range, asymptomatic  -- Reactive NST: 130s/mod/+a/-d; no contractions   Assessment and Plan  28yo G3P1011 at [redacted]w[redacted]d who presented with multiple complaints including recent headache, LE edema and concern for preeclampsia. Had preeclampsia during last pregnancy, reports no  complications so far this pregnancy. Headache and edema had resolved by time of arrival and BP remained within normal limits. HELLP labs checked and wnl. Reviewed signs/symptoms and return precautions for preE. Recent nausea and vomiting may have been related to viral illness but now resolved. Lower abdominal pain likely related to round ligament pain. Encouraged to follow-up with prenatal provider in [place]. Patient voiced understanding of plan. All questions answered, stable for discharge home.   Tamera Stands, DO 08/02/2018, 7:09 PM

## 2019-01-09 ENCOUNTER — Encounter (HOSPITAL_COMMUNITY): Payer: Self-pay

## 2023-05-30 ENCOUNTER — Ambulatory Visit (HOSPITAL_COMMUNITY)
Admission: EM | Admit: 2023-05-30 | Discharge: 2023-05-30 | Disposition: A | Discharge status: Home / Self Care | Payer: Federal, State, Local not specified - PPO | Attending: Emergency Medicine | Admitting: Emergency Medicine

## 2023-05-30 ENCOUNTER — Encounter (HOSPITAL_COMMUNITY): Payer: Self-pay | Admitting: *Deleted

## 2023-05-30 DIAGNOSIS — R52 Pain, unspecified: Secondary | ICD-10-CM | POA: Diagnosis present

## 2023-05-30 DIAGNOSIS — R5383 Other fatigue: Secondary | ICD-10-CM

## 2023-05-30 DIAGNOSIS — F5089 Other specified eating disorder: Secondary | ICD-10-CM

## 2023-05-30 LAB — CBC
HCT: 26.9 % — ABNORMAL LOW (ref 36.0–46.0)
Hemoglobin: 8 g/dL — ABNORMAL LOW (ref 12.0–15.0)
MCH: 20.9 pg — ABNORMAL LOW (ref 26.0–34.0)
MCHC: 29.7 g/dL — ABNORMAL LOW (ref 30.0–36.0)
MCV: 70.4 fL — ABNORMAL LOW (ref 80.0–100.0)
Platelets: 514 10*3/uL — ABNORMAL HIGH (ref 150–400)
RBC: 3.82 MIL/uL — ABNORMAL LOW (ref 3.87–5.11)
RDW: 20.1 % — ABNORMAL HIGH (ref 11.5–15.5)
WBC: 5.4 10*3/uL (ref 4.0–10.5)
nRBC: 0 % (ref 0.0–0.2)

## 2023-05-30 LAB — BASIC METABOLIC PANEL
Anion gap: 5 (ref 5–15)
BUN: 11 mg/dL (ref 6–20)
CO2: 25 mmol/L (ref 22–32)
Calcium: 9.2 mg/dL (ref 8.9–10.3)
Chloride: 108 mmol/L (ref 98–111)
Creatinine, Ser: 0.73 mg/dL (ref 0.44–1.00)
GFR, Estimated: >60 mL/min (ref >60)
Glucose, Bld: 101 mg/dL — ABNORMAL HIGH (ref 70–99)
Potassium: 3.9 mmol/L (ref 3.5–5.1)
Sodium: 138 mmol/L (ref 135–145)

## 2023-05-30 MED ORDER — ONDANSETRON 4 MG PO TBDP: 4.0000 mg | ORAL_TABLET | Freq: Three times a day (TID) | ORAL | 0 refills | Status: DC | PRN

## 2023-05-30 NOTE — ED Triage Notes (Signed)
Pt states she has had fatigue, body aches always. She states that she vomited toady. She wants to know if she has anemia today, states she has had it in the past.

## 2023-05-30 NOTE — Discharge Instructions (Signed)
We have checked some basic labs today.  I suspect you have anemia.  This will show in your lab work.  If it does show that you are anemic please take a daily over-the-counter iron supplement, 65 mg ferrous sulfate once daily is a good start.  You can also increase your intake of iron with red meats and leafy greens.  As Urgent Care providers, we only only can evaluate you for an episodic event; and this cannot be substituted for the continued care and monitoring by your primary care provider and/or specialist. It is not unusual that a medical condition can present itself in one way, then progress or change and lead to another impression of your medical condition. If you should have any new or worsening symptoms, please go to the closest emergency department and contact your primary physician as soon as possible for further evaluation and testing.

## 2023-05-30 NOTE — ED Provider Notes (Addendum)
[facility]    CSN: [id number] Arrival date & time: 05/30/23  1455      History   Chief Complaint Chief Complaint  Patient presents with   Emesis   Fatigue   Generalized Body Aches    HPI Diane [name] is a 32 y.o. female.   Patient presents to clinic for fatigue and generalized bodyaches that have been present for many years.  She also gets cold easily and eats a lot of African chalk.   Reports she has a history of anemia, is not on any iron supplements and has not had her labs checked in many years.  She is in bed all day yesterday and did not feel well.  She has not had any dizziness or loss of consciousness.  She did recently finished her menstrual cycle on Tuesday and that her menstrual cycles are heavy when they occur.  No recent illness, no cough, congestion, or fever.   The history is provided by the patient and medical records.  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 Authorizing Provider  acetaminophen (TYLENOL) 650 MG CR tablet Take 1,300 mg by mouth every 8 (eight) hours as needed for pain.    [provider]  Prenatal Vit-Fe Fumarate-FA (PRENATAL VITAMIN PO) Take 1 tablet by mouth daily. gummies    [provider]    Family History Family History  Problem Relation Age of Onset   Hypertension Mother      Social History Social History   Tobacco Use   Smoking status: Never   Smokeless tobacco: Never  Vaping Use   Vaping status: Never Used  Substance Use Topics   Alcohol use: No    Alcohol/week: 0.0 standard drinks of alcohol   Drug use: No    Comment: Discussed birth control options     Allergies   Coconut flavor, Fish allergy, and Pineapple   Review of Systems Review of Systems  Per HPI   Physical Exam Triage Vital Signs ED Triage Vitals  Encounter Vitals Group     BP 05/30/23 1546 130/75     Systolic BP Percentile --      Diastolic BP Percentile --      Pulse Rate 05/30/23 1546 77     Resp 05/30/23 1546 18     Temp 05/30/23 1546 98.2 F (36.8 C)     Temp Source 05/30/23 1546 Oral     SpO2 05/30/23 1546 96 %     Weight --      Height --  Head Circumference --      Peak Flow --      Pain Score 05/30/23 1544 0     Pain Loc --      Pain Education --      Exclude from Growth Chart --    No data found.  Updated Vital Signs BP 130/75 (BP Location: Right Arm)   Pulse 77   Temp 98.2 F (36.8 C) (Oral)   Resp 18   LMP 05/21/2023 (Approximate)   SpO2 96%   Visual Acuity Right Eye Distance:   Left Eye Distance:   Bilateral Distance:    Right Eye Near:   Left Eye Near:    Bilateral Near:     Physical Exam Vitals and nursing note reviewed.  Constitutional:      Appearance: Normal appearance.  HENT:     Head: Normocephalic and atraumatic.     Right Ear: External ear normal.     Left Ear: External ear normal.     Nose: Nose normal.     Mouth/Throat:     Mouth: Mucous membranes are moist.  Eyes:     Conjunctiva/sclera: Conjunctivae normal.  Cardiovascular:     Rate and Rhythm: Normal rate and regular rhythm.     Heart sounds: Normal heart sounds. No murmur heard. Pulmonary:     Effort: Pulmonary effort is normal. No respiratory distress.     Breath sounds: Normal breath sounds.  Musculoskeletal:        General: Normal range of motion.   Skin:    General: Skin is warm and dry.  Neurological:     General: No focal deficit present.     Mental Status: She is alert.  Psychiatric:        Mood and Affect: Mood normal.      UC Treatments / Results  Labs (all labs ordered are listed, but only abnormal results are displayed) Labs Reviewed  CBC  BASIC METABOLIC PANEL    EKG   Radiology No results found.  Procedures Procedures (including critical care time)  Medications Ordered in UC Medications - No data to display  Initial Impression / Assessment and Plan / UC Course  I have reviewed the triage vital signs and the nursing notes.  Pertinent labs & imaging results that were available during my care of the patient were reviewed by me and considered in my medical decision making (see chart for details).  Vitals and triage reviewed, patient is hemodynamically stable.  Lungs are vesicular, heart with regular rate and rhythm.  Lower canthus inverted and does appear to be pale.  Patient does have pica, fatigue and bodyaches.  Suspect anemia, could be exacerbated due to recent menstrual cycle.  Basic labs checked today in clinic.  Staff will schedule with the PCP for follow-up and further evaluation.  Plan of care, follow-up care return precautions given, no questions at this time.  At the time of discharge patient reports nausea, will send in Zofran.    Final Clinical Impressions(s) / UC Diagnoses   Final diagnoses:  Other fatigue  Body aches  Pica     Discharge Instructions      We have checked some basic labs today.  I suspect you have anemia.  This will show in your lab work.  If it does show that you are anemic please take a daily over-the-counter iron supplement, 65 mg ferrous sulfate once daily is a good start.  You can also increase your intake of iron with red  meats and leafy greens.  As Urgent Care providers, we only only can evaluate you for an episodic event; and this cannot be substituted for the  continued care and monitoring by your primary care provider and/or specialist. It is not unusual that a medical condition can present itself in one way, then progress or change and lead to another impression of your medical condition. If you should have any new or worsening symptoms, please go to the closest emergency department and contact your primary physician as soon as possible for further evaluation and testing.       ED Prescriptions   None    PDMP not reviewed this encounter.   Prudence Heiny, [place] N, FNP 05/30/23 1618    Keshaun Dubey, [place] N, [place] 05/30/23 [phone]

## 2023-06-16 ENCOUNTER — Telehealth: Payer: Self-pay

## 2023-06-16 NOTE — Telephone Encounter (Signed)
Left patient a detailed voice message to return call to office to reschedule appt 06/17/2023 with Dr. Janee Morn.

## 2023-06-17 ENCOUNTER — Ambulatory Visit: Payer: Medicaid Other | Admitting: Family Medicine

## 2023-07-17 DIAGNOSIS — Z91013 Allergy to seafood: Secondary | ICD-10-CM | POA: Insufficient documentation

## 2023-07-17 DIAGNOSIS — D500 Iron deficiency anemia secondary to blood loss (chronic): Secondary | ICD-10-CM | POA: Insufficient documentation

## 2023-07-17 DIAGNOSIS — N921 Excessive and frequent menstruation with irregular cycle: Secondary | ICD-10-CM | POA: Insufficient documentation

## 2023-07-17 DIAGNOSIS — Z91018 Allergy to other foods: Secondary | ICD-10-CM | POA: Insufficient documentation

## 2023-07-17 DIAGNOSIS — D649 Anemia, unspecified: Secondary | ICD-10-CM | POA: Insufficient documentation

## 2023-07-18 ENCOUNTER — Ambulatory Visit: Payer: Federal, State, Local not specified - PPO | Admitting: Family Medicine

## 2023-07-18 ENCOUNTER — Encounter: Payer: Self-pay | Admitting: Family Medicine

## 2023-07-18 VITALS — BP 120/66 | HR 74 | Temp 98.3°F | Ht 67.0 in | Wt 184.6 lb

## 2023-07-18 DIAGNOSIS — Z13220 Encounter for screening for lipoid disorders: Secondary | ICD-10-CM | POA: Diagnosis not present

## 2023-07-18 DIAGNOSIS — N921 Excessive and frequent menstruation with irregular cycle: Secondary | ICD-10-CM | POA: Diagnosis not present

## 2023-07-18 DIAGNOSIS — D500 Iron deficiency anemia secondary to blood loss (chronic): Secondary | ICD-10-CM

## 2023-07-18 DIAGNOSIS — Z131 Encounter for screening for diabetes mellitus: Secondary | ICD-10-CM

## 2023-07-18 DIAGNOSIS — Z1159 Encounter for screening for other viral diseases: Secondary | ICD-10-CM

## 2023-07-18 DIAGNOSIS — Z201 Contact with and (suspected) exposure to tuberculosis: Secondary | ICD-10-CM

## 2023-07-18 DIAGNOSIS — Z23 Encounter for immunization: Secondary | ICD-10-CM

## 2023-07-18 NOTE — Assessment & Plan Note (Signed)
Likely the underlying cause for her anemia. I recommend she return for a PE and pelvic exam.

## 2023-07-18 NOTE — Assessment & Plan Note (Signed)
I iwll reassess ehr CBC and check an iron profile.

## 2023-07-18 NOTE — Progress Notes (Signed)
[facility] [address] [place] [address] Dept: [phone] Dept Fax: [phone]  New Patient Office Visit  Subjective:    Patient ID: Diane [name], female    DOB: 1990-07-25, 33 y.o..   MRN: [id number]  Chief Complaint  Patient presents with   Establish Care    NP- establish care.  C/o anemia.    History of Present Illness:  Patient is in today to establish care. Diane [name] was born in [place], [place]. She immigrated to the [place] in 2010. She has attended some general classes at [employer], but has not completed a degree. She is currently separated form her husband. She has two children (7,4). She denies use of tobacco, alcohol, or drugs.  Diane [name] has a history of anemia. She was seen at [facility] in Nov. with fatigue and found to have a low Hgb. She was advised to take an iron supplement, but notes she has not done this. She continues to experience some fatigue. She admits to irregular menstrual bleeding and heavy periods at times.   Diane [name] record indicates TB exposure. She notes that this had to do with testing around the time of her immigration. She denies any fever, night sweats, chronic cough, or weight loss. She is not aware that she was exposed to TB. She did receive a BCG vaccine in childhood.  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      ondansetron (ZOFRAN-ODT) 4 MG disintegrating tablet Take 1 tablet (4 mg total) by mouth every 8 (eight) hours as needed for nausea or vomiting. 20 tablet 0   Prenatal Vit-Fe Fumarate-FA (PRENATAL VITAMIN PO) Take 1 tablet by mouth daily. gummies     No facility-administered medications prior to visit.   Allergies  Allergen Reactions   Coconut Flavoring Agent (Non-Screening) Rash   Fish Allergy Rash    "Mackeral" only   Pineapple Rash   Objective:   Today's Vitals   07/18/23 1559  BP: 120/66  Pulse: 74  Temp: 98.3 F (36.8 C)  TempSrc: Temporal  SpO2: 98%  Weight: 184 lb 9.6 oz (83.7 kg)  Height: 5\' 7"  (1.702 m)   Body mass index is 28.91 kg/m.   General: Well developed, well nourished. No acute distress. Psych: Alert and oriented. Normal mood and affect.  Health Maintenance Due  Topic Date Due   Hepatitis C Screening  Never done   Cervical Cancer Screening (HPV/Pap Cotest)  02/17/2020   Lab Results    Latest Ref Rng & Units 05/30/2023    4:12 PM 08/02/2018    4:28 PM 04/17/2016    5:07 AM  CBC  WBC 4.0 - 10.5 K/uL 5.4  7.9  8.8   Hemoglobin 12.0 - 15.0 g/dL 8.0  02.7  9.0   Hematocrit 36.0 - 46.0 % 26.9  31.8  25.3   Platelets 150 -  400 K/uL 514  176  184       Assessment & Plan:   Problem List Items Addressed This Visit       Other   Anemia - Primary   I iwll reassess ehr CBC and check an iron profile.      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 I recommend she return for a PE and pelvic exam.       Other Visit Diagnoses       Need for Tdap vaccination       Relevant Orders   Tdap vaccine greater than or equal to 7yo IM (Completed)     Screening for lipid disorders         Encounter for hepatitis C screening test for low risk patient       Relevant Orders   HCV Ab w Reflex to Quant PCR     Screening for diabetes mellitus (DM)       Relevant  Orders   Basic metabolic panel       Return in about 4 weeks (around 08/15/2023) for Annual preventative care.   Loyola Mast, MD

## 2023-07-19 LAB — CBC
HCT: 27.4 % — ABNORMAL LOW (ref 35.0–45.0)
Hemoglobin: 7.9 g/dL — ABNORMAL LOW (ref 11.7–15.5)
MCH: 21.1 pg — ABNORMAL LOW (ref 27.0–33.0)
MCHC: 28.8 g/dL — ABNORMAL LOW (ref 32.0–36.0)
MCV: 73.1 fL — ABNORMAL LOW (ref 80.0–100.0)
MPV: 10 fL (ref 7.5–12.5)
Platelets: 513 10*3/uL — ABNORMAL HIGH (ref 140–400)
RBC: 3.75 10*6/uL — ABNORMAL LOW (ref 3.80–5.10)
RDW: 18.1 % — ABNORMAL HIGH (ref 11.0–15.0)
WBC: 3.8 10*3/uL (ref 3.8–10.8)

## 2023-07-19 LAB — IRON,TIBC AND FERRITIN PANEL
%SAT: 3 % — ABNORMAL LOW (ref 16–45)
Ferritin: 5 ng/mL — ABNORMAL LOW (ref 16–154)
Iron: 15 ug/dL — ABNORMAL LOW (ref 40–190)
TIBC: 436 ug/dL (ref 250–450)

## 2023-07-19 LAB — BASIC METABOLIC PANEL
BUN: 10 mg/dL (ref 7–25)
CO2: 23 mmol/L (ref 20–32)
Calcium: 9.1 mg/dL (ref 8.6–10.2)
Chloride: 108 mmol/L (ref 98–110)
Creat: 0.64 mg/dL (ref 0.50–0.97)
Glucose, Bld: 80 mg/dL (ref 65–99)
Potassium: 3.8 mmol/L (ref 3.5–5.3)
Sodium: 140 mmol/L (ref 135–146)

## 2023-07-19 LAB — HCV AB W REFLEX TO QUANT PCR: HCV Ab: NONREACTIVE

## 2023-07-19 LAB — HCV INTERPRETATION

## 2023-07-21 ENCOUNTER — Encounter: Payer: Self-pay | Admitting: Family Medicine

## 2023-07-21 MED ORDER — IRON (FERROUS SULFATE) 325 (65 FE) MG PO TABS: 325.0000 mg | ORAL_TABLET | Freq: Every day | ORAL | 2 refills | Status: DC

## 2023-07-21 NOTE — Addendum Note (Signed)
Addended by: Loyola Mast on: 07/21/2023 08:09 AM   Modules accepted: Orders

## 2023-08-15 ENCOUNTER — Other Ambulatory Visit: Payer: Self-pay | Admitting: Family Medicine

## 2023-08-15 ENCOUNTER — Encounter: Payer: Self-pay | Admitting: Family Medicine

## 2023-08-15 ENCOUNTER — Other Ambulatory Visit (HOSPITAL_COMMUNITY)
Admission: RE | Admit: 2023-08-15 | Discharge: 2023-08-15 | Disposition: A | Discharge status: Home / Self Care | Payer: Medicaid Other | Source: Ambulatory Visit | Attending: Family Medicine | Admitting: Family Medicine

## 2023-08-15 ENCOUNTER — Ambulatory Visit (INDEPENDENT_AMBULATORY_CARE_PROVIDER_SITE_OTHER): Payer: Medicaid Other | Admitting: Family Medicine

## 2023-08-15 VITALS — BP 118/72 | HR 85 | Temp 98.4°F | Ht 67.0 in | Wt 184.2 lb

## 2023-08-15 DIAGNOSIS — Z0000 Encounter for general adult medical examination without abnormal findings: Secondary | ICD-10-CM | POA: Diagnosis not present

## 2023-08-15 DIAGNOSIS — Z124 Encounter for screening for malignant neoplasm of cervix: Secondary | ICD-10-CM | POA: Diagnosis present

## 2023-08-15 DIAGNOSIS — N898 Other specified noninflammatory disorders of vagina: Secondary | ICD-10-CM | POA: Insufficient documentation

## 2023-08-15 DIAGNOSIS — N644 Mastodynia: Secondary | ICD-10-CM

## 2023-08-15 DIAGNOSIS — R32 Unspecified urinary incontinence: Secondary | ICD-10-CM

## 2023-08-15 DIAGNOSIS — D500 Iron deficiency anemia secondary to blood loss (chronic): Secondary | ICD-10-CM

## 2023-08-15 DIAGNOSIS — R238 Other skin changes: Secondary | ICD-10-CM | POA: Diagnosis not present

## 2023-08-15 DIAGNOSIS — Z113 Encounter for screening for infections with a predominantly sexual mode of transmission: Secondary | ICD-10-CM | POA: Diagnosis not present

## 2023-08-15 DIAGNOSIS — N921 Excessive and frequent menstruation with irregular cycle: Secondary | ICD-10-CM | POA: Diagnosis not present

## 2023-08-15 DIAGNOSIS — M7582 Other shoulder lesions, left shoulder: Secondary | ICD-10-CM

## 2023-08-15 DIAGNOSIS — N888 Other specified noninflammatory disorders of cervix uteri: Secondary | ICD-10-CM

## 2023-08-15 MED ORDER — NAPROXEN 500 MG PO TABS: 500.0000 mg | ORAL_TABLET | Freq: Two times a day (BID) | ORAL | 0 refills | Status: DC
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
## 2023-08-15 NOTE — Assessment & Plan Note (Signed)
Did not have the opportunity to fully address today. I will have GYN assess.

## 2023-08-15 NOTE — Assessment & Plan Note (Signed)
Pelvic exam does not show an enlarged uterus. As she will be seeing GYN, they can consider if she might need a pelvic ultrasound to assess for causes of heavier bleeding.

## 2023-08-15 NOTE — Assessment & Plan Note (Signed)
I suspect this is a mucinous cyst, but it is quite large. I will refer her to GYN for evaluation and recommendation of management.

## 2023-08-15 NOTE — Assessment & Plan Note (Signed)
Diane [name] notes she is not good with taking pills. I will refer her to hematology to see about iron infusions to help replace her iron stores and resolve her anemia.

## 2023-08-19 LAB — CERVICOVAGINAL ANCILLARY ONLY
Bacterial Vaginitis (gardnerella): NEGATIVE
Candida Glabrata: NEGATIVE
Candida Vaginitis: NEGATIVE
Comment: NEGATIVE
Comment: NEGATIVE
Comment: NEGATIVE
Comment: NEGATIVE
Trichomonas: NEGATIVE

## 2023-08-19 LAB — CYTOLOGY - PAP
Adequacy: ABSENT
Comment: NEGATIVE
Diagnosis: NEGATIVE
High risk HPV: NEGATIVE

## 2023-08-23 ENCOUNTER — Telehealth: Payer: Self-pay | Admitting: Hematology and Oncology

## 2023-08-23 ENCOUNTER — Inpatient Hospital Stay: Payer: Medicaid Other

## 2023-08-23 ENCOUNTER — Inpatient Hospital Stay: Payer: Medicaid Other | Attending: Hematology and Oncology | Admitting: Hematology and Oncology

## 2023-08-23 VITALS — BP 126/74 | HR 74 | Temp 97.9°F | Resp 16 | Wt 181.2 lb

## 2023-08-23 DIAGNOSIS — Z79899 Other long term (current) drug therapy: Secondary | ICD-10-CM | POA: Diagnosis not present

## 2023-08-23 DIAGNOSIS — D500 Iron deficiency anemia secondary to blood loss (chronic): Secondary | ICD-10-CM

## 2023-08-23 DIAGNOSIS — D509 Iron deficiency anemia, unspecified: Secondary | ICD-10-CM | POA: Insufficient documentation

## 2023-08-23 DIAGNOSIS — N920 Excessive and frequent menstruation with regular cycle: Secondary | ICD-10-CM | POA: Insufficient documentation

## 2023-08-23 LAB — CBC WITH DIFFERENTIAL/PLATELET
Abs Immature Granulocytes: 0.01 10*3/uL (ref 0.00–0.07)
Basophils Absolute: 0 10*3/uL (ref 0.0–0.1)
Basophils Relative: 1 %
Eosinophils Absolute: 0 10*3/uL (ref 0.0–0.5)
Eosinophils Relative: 1 %
HCT: 26.3 % — ABNORMAL LOW (ref 36.0–46.0)
Hemoglobin: 7.8 g/dL — ABNORMAL LOW (ref 12.0–15.0)
Immature Granulocytes: 0 %
Lymphocytes Relative: 32 %
Lymphs Abs: 1.1 10*3/uL (ref 0.7–4.0)
MCH: 20.7 pg — ABNORMAL LOW (ref 26.0–34.0)
MCHC: 29.7 g/dL — ABNORMAL LOW (ref 30.0–36.0)
MCV: 69.9 fL — ABNORMAL LOW (ref 80.0–100.0)
Monocytes Absolute: 0.2 10*3/uL (ref 0.1–1.0)
Monocytes Relative: 7 %
Neutro Abs: 2.1 10*3/uL (ref 1.7–7.7)
Neutrophils Relative %: 59 %
Platelets: 403 10*3/uL — ABNORMAL HIGH (ref 150–400)
RBC: 3.76 MIL/uL — ABNORMAL LOW (ref 3.87–5.11)
RDW: 19.9 % — ABNORMAL HIGH (ref 11.5–15.5)
WBC: 3.5 10*3/uL — ABNORMAL LOW (ref 4.0–10.5)
nRBC: 0 % (ref 0.0–0.2)

## 2023-08-23 LAB — IRON AND IRON BINDING CAPACITY (CC-WL,HP ONLY)
Iron: 9 ug/dL — ABNORMAL LOW (ref 28–170)
Saturation Ratios: 2 % — ABNORMAL LOW (ref 10.4–31.8)
TIBC: 458 ug/dL — ABNORMAL HIGH (ref 250–450)
UIBC: 449 ug/dL — ABNORMAL HIGH (ref 148–442)

## 2023-08-23 LAB — FERRITIN: Ferritin: 2 ng/mL — ABNORMAL LOW (ref 11–307)

## 2023-08-23 NOTE — Progress Notes (Signed)
 [facility] CONSULT NOTE  Patient Care Team: Loyola Mast, MD as PCP - General (Family Medicine)  CHIEF COMPLAINTS/PURPOSE OF CONSULTATION:  [name]  ASSESSMENT & PLAN:   Iron Deficiency Anemia Chronic iron deficiency anemia with symptoms of fatigue, cold intolerance, and pica (craving for clay and charcoal). Contributing factors include frequent menstruation, childbirth, and breastfeeding. Poor adherence to oral iron supplementation due to constipation and difficulty with pill regimen. -Plan for iron infusion therapy, pending insurance approval.  -There are several formularies of intravenous iron available in the market.  We have discussed about risk of allergic/infusion reactions including potentially life-threatening anaphylaxis with intravenous iron however these serious allergic reactions are exceedingly rare and overestimated.  In contrast to serious allergic reactions, IV iron may be associated with nonallergic infusion reactions including self-limited urticaria, palpitations, dizziness, neck and back spasm which again occur in less than 1% of the individuals and do not progress to more serious reactions. - She is willing to consider IV iron -Plan placed for venofer 300 mg times 3. - FU in 6 months with repeat labs.  Menstrual Irregularities Frequent and heavy menstruation, contributing to iron deficiency anemia. -Addressing menstrual irregularities will be key to long-term management of iron deficiency anemia. Further evaluation and management of menorrhagia per PCP  General Health Maintenance -Order labs today for baseline prior to starting iron infusion therapy.  HISTORY OF PRESENTING ILLNESS:  Diane [name] 33 y.o. female is here because of [name]  Discussed the use of AI scribe software for clinical note transcription with the patient, who gave verbal consent to proceed.  History of Present Illness    Diane [name] is a 33 year old female with iron  deficiency anemia who presents with fatigue and pica.  She has experienced iron deficiency anemia for an extended period, struggling to maintain a regular intake of iron supplements due to side effects such as constipation and a general aversion to taking pills. She has not previously received iron infusions.  She experiences fatigue, feeling tired, and excessive sleepiness, particularly after work. She also reports coldness, especially in her hands. Additionally, she has cravings for non-nutritive substances such as charcoal and clay, which she describes as 'weird things'. She has been fighting these cravings, known as pica, for the past two months.  Her menstrual cycles are irregular, occurring twice a month with a cycle length of 19 to 21 days. She describes her periods as heavy, lasting about a week, with the need to change heavy pads multiple times a day, especially if she consumes sweets. She notes a familial pattern of short menstrual cycles shared with her mother and sister.  She has a history of a hernia repair in 2011 and reports no other significant surgeries. She consumes meat daily but still experiences iron deficiency. She has two children, the youngest being 50 years old, and breastfed each child for two years. She has not experienced blood loss through stool or urine.  All other systems were reviewed with the patient and are negative.  MEDICAL HISTORY:  Past Medical History:  Diagnosis Date   Dysmenorrhea    Medical history non-contributory    Pregnancy induced hypertension     SURGICAL HISTORY: Past Surgical History:  Procedure Laterality Date   UMBILICAL HERNIA REPAIR  07/01/2009   umbilical     SOCIAL HISTORY: Social History   Socioeconomic History   Marital status: Married    Spouse name: Not on file   Number of children: 2   Years  of education: 15   Highest education level: Some college, no degree  Occupational History   Occupation: Scientist, product/process development  Tobacco Use    Smoking status: Never   Smokeless tobacco: Never  Vaping Use   Vaping status: Never Used  Substance and Sexual Activity   Alcohol use: No    Alcohol/week: 0.0 standard drinks of alcohol   Drug use: No    Comment: Discussed birth control options   Sexual activity: Yes    Partners: Male    Birth control/protection: None  Other Topics Concern   Not on file  Social History Narrative   Not on file   Social Drivers of Health   Financial Resource Strain: Not on file  Food Insecurity: Not on file  Transportation Needs: Not on file  Physical Activity: Not on file  Stress: Not on file  Social Connections: Not on file  Intimate Partner Violence: Not on file    FAMILY HISTORY: Family History  Problem Relation Age of Onset   Hypertension Mother     ALLERGIES:  is allergic to coconut flavoring agent (non-screening), fish allergy, and pineapple.  MEDICATIONS:  No current outpatient medications on file.   No current facility-administered medications for this visit.     PHYSICAL EXAMINATION: ECOG PERFORMANCE STATUS: 0 - Asymptomatic  Vitals:   08/23/23 0900  BP: 126/74  Pulse: 74  Resp: 16  Temp: 97.9 F (36.6 C)  SpO2: 100%   Filed Weights   08/23/23 0900  Weight: 181 lb 3.2 oz (82.2 kg)    GENERAL:alert, no distress and comfortable SKIN: skin color, texture, turgor are normal, no rashes or significant lesions EYES: normal, conjunctiva are pink and non-injected, sclera clear OROPHARYNX:no exudate, no erythema and lips, buccal mucosa, and tongue normal  NECK: supple, thyroid normal size, non-tender, without nodularity LYMPH:  no palpable lymphadenopathy in the cervical, axillary  LUNGS: clear to auscultation and percussion with normal breathing effort HEART: regular rate & rhythm and no murmurs and no lower extremity edema ABDOMEN:abdomen soft, non-tender and normal bowel sounds Musculoskeletal:no cyanosis of digits and no clubbing  PSYCH: alert & oriented x 3  with fluent speech NEURO: no focal motor/sensory deficits  LABORATORY DATA:  I have reviewed the data as listed Lab Results  Component Value Date   WBC 3.8 07/18/2023   HGB 7.9 (L) 07/18/2023   HCT 27.4 (L) 07/18/2023   MCV 73.1 (L) 07/18/2023   PLT 513 (H) 07/18/2023     Chemistry      Component Value Date/Time   NA 140 07/18/2023 1633   K 3.8 07/18/2023 1633   CL 108 07/18/2023 1633   CO2 23 07/18/2023 1633   BUN 10 07/18/2023 1633   CREATININE 0.64 07/18/2023 1633      Component Value Date/Time   CALCIUM 9.1 07/18/2023 1633   ALKPHOS 95 08/02/2018 1628   AST 15 08/02/2018 1628   ALT 17 08/02/2018 1628   BILITOT 0.8 08/02/2018 1628       RADIOGRAPHIC STUDIES: I have personally reviewed the radiological images as listed and agreed with the findings in the report. No results found.  All questions were answered. The patient knows to call the clinic with any problems, questions or concerns. I spent 30 minutes in the care of this patient including H and P, review of records, counseling and coordination of care.     Rachel Moulds, MD 08/23/2023 9:13 AM

## 2023-08-23 NOTE — Telephone Encounter (Signed)
 Spoke with patient confirming upcoming appointment

## 2023-09-05 ENCOUNTER — Inpatient Hospital Stay: Payer: Medicaid Other | Attending: Hematology and Oncology

## 2023-09-05 VITALS — BP 115/71 | HR 70 | Temp 98.2°F | Resp 14

## 2023-09-05 DIAGNOSIS — D509 Iron deficiency anemia, unspecified: Secondary | ICD-10-CM | POA: Insufficient documentation

## 2023-09-05 DIAGNOSIS — D500 Iron deficiency anemia secondary to blood loss (chronic): Secondary | ICD-10-CM

## 2023-09-05 MED ORDER — SODIUM CHLORIDE 0.9 % IV SOLN
300.0000 mg | Freq: Once | INTRAVENOUS | Status: AC
  Administered 2023-09-05: 300 mg via INTRAVENOUS
  Filled 2023-09-05: qty 300

## 2023-09-05 MED ORDER — SODIUM CHLORIDE 0.9 % IV SOLN: INTRAVENOUS | Status: DC

## 2023-09-05 NOTE — Progress Notes (Signed)
 Patient arrived for first time venofer infusion. Observed for 30 minutes- tolerated well with no symptoms. VSS-  BP 115/71 (BP Location: Right Arm, Patient Position: Sitting)   Pulse 70   Temp 98.2 F (36.8 C) (Oral)   Resp 14   SpO2 100%    Ambulatory to the lobby with no complaints.

## 2023-09-05 NOTE — Patient Instructions (Signed)

## 2023-09-08 ENCOUNTER — Encounter: Payer: Self-pay | Admitting: Hematology and Oncology

## 2023-09-09 ENCOUNTER — Encounter (HOSPITAL_BASED_OUTPATIENT_CLINIC_OR_DEPARTMENT_OTHER): Payer: Self-pay | Admitting: Certified Nurse Midwife

## 2023-09-09 ENCOUNTER — Ambulatory Visit (HOSPITAL_BASED_OUTPATIENT_CLINIC_OR_DEPARTMENT_OTHER): Payer: Medicaid Other | Admitting: Certified Nurse Midwife

## 2023-09-09 VITALS — BP 116/77 | HR 80 | Ht 67.0 in | Wt 181.0 lb

## 2023-09-09 DIAGNOSIS — N888 Other specified noninflammatory disorders of cervix uteri: Secondary | ICD-10-CM | POA: Diagnosis not present

## 2023-09-09 DIAGNOSIS — N924 Excessive bleeding in the premenopausal period: Secondary | ICD-10-CM

## 2023-09-09 NOTE — Progress Notes (Unsigned)
  Diane [name] is a 33yo [id number] (has 2 healthy children) here for referral from Primary Care Provider. She had a recent speculum exam/pap smear (Negative) and MD noted a cyst on her cervix.   Pt reports periods are monthly, regular and "heavy". She is currently scheduled for Iron infusions due to anemia (possibly due to menorrhagia). She is not interested in hormonal contraception, IUD, Nexplanon or Depo Provera. Declines all medication therapy for menorrhagia although pt verbalizes understanding that anemia may be a result of heavy menstrual periods.  HPI: 33 y.o. [id number] Married [name] here for evaluation of cyst on cervix.   Past Medical History:  Diagnosis Date   Dysmenorrhea    Medical history non-contributory    Pregnancy induced hypertension     MEDS:   No current outpatient medications on file prior to visit.   No current facility-administered medications on file prior to visit.    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  Assessment/Plan: 1. Nabothian cyst (Primary) - Pt scheduled for GYN Transvaginal US to further evaluate and confirm that cyst  on cervix represents Nabothian Cyst  2. Excessive bleeding in premenopausal period - Pt not interested in any options (hormonally or otherwise) for heavy menstrual bleeding  RTO as scheduled for [place] and follow-up with Provider afterwards to discuss US findings. Diane [name]

## 2023-09-10 DIAGNOSIS — N888 Other specified noninflammatory disorders of cervix uteri: Secondary | ICD-10-CM | POA: Insufficient documentation

## 2023-09-13 ENCOUNTER — Inpatient Hospital Stay: Payer: Medicaid Other

## 2023-09-13 VITALS — BP 110/68 | HR 70 | Temp 98.2°F | Resp 17

## 2023-09-13 DIAGNOSIS — D500 Iron deficiency anemia secondary to blood loss (chronic): Secondary | ICD-10-CM

## 2023-09-13 DIAGNOSIS — D509 Iron deficiency anemia, unspecified: Secondary | ICD-10-CM | POA: Diagnosis not present

## 2023-09-13 MED ORDER — IRON SUCROSE 20 MG/ML IV SOLN
300.0000 mg | Freq: Once | INTRAVENOUS | Status: AC
  Administered 2023-09-13: 300 mg via INTRAVENOUS
  Filled 2023-09-13: qty 300

## 2023-09-13 MED ORDER — SODIUM CHLORIDE 0.9 % IV SOLN: INTRAVENOUS | Status: DC

## 2023-09-13 NOTE — Progress Notes (Signed)
Tolerated infusion well, observed for 30 minutes. VSS, discharged alert and ambulatory.

## 2023-09-13 NOTE — Patient Instructions (Signed)

## 2023-09-19 MED FILL — Iron Sucrose Inj 20 MG/ML (Fe Equiv): INTRAVENOUS | Qty: 15 | Status: AC

## 2023-09-20 ENCOUNTER — Inpatient Hospital Stay: Payer: Medicaid Other

## 2023-09-20 VITALS — BP 116/72 | HR 72 | Temp 98.4°F | Resp 16

## 2023-09-20 DIAGNOSIS — D509 Iron deficiency anemia, unspecified: Secondary | ICD-10-CM | POA: Diagnosis not present

## 2023-09-20 DIAGNOSIS — D500 Iron deficiency anemia secondary to blood loss (chronic): Secondary | ICD-10-CM

## 2023-09-20 MED ORDER — SODIUM CHLORIDE 0.9 % IV SOLN: INTRAVENOUS | Status: DC

## 2023-09-20 MED ORDER — SODIUM CHLORIDE 0.9 % IV SOLN
300.0000 mg | Freq: Once | INTRAVENOUS | Status: AC
  Administered 2023-09-20: 300 mg via INTRAVENOUS
  Filled 2023-09-20: qty 300

## 2023-09-20 NOTE — Patient Instructions (Signed)

## 2023-10-01 ENCOUNTER — Other Ambulatory Visit (HOSPITAL_BASED_OUTPATIENT_CLINIC_OR_DEPARTMENT_OTHER)

## 2023-10-07 ENCOUNTER — Other Ambulatory Visit (HOSPITAL_BASED_OUTPATIENT_CLINIC_OR_DEPARTMENT_OTHER): Payer: Self-pay | Admitting: *Deleted

## 2023-10-07 DIAGNOSIS — N926 Irregular menstruation, unspecified: Secondary | ICD-10-CM

## 2023-10-07 NOTE — Progress Notes (Signed)
 Chart reviewed [place] order entered

## 2023-10-08 ENCOUNTER — Ambulatory Visit (HOSPITAL_BASED_OUTPATIENT_CLINIC_OR_DEPARTMENT_OTHER): Admitting: Certified Nurse Midwife

## 2023-10-08 ENCOUNTER — Encounter (HOSPITAL_BASED_OUTPATIENT_CLINIC_OR_DEPARTMENT_OTHER): Payer: Self-pay | Admitting: Certified Nurse Midwife

## 2023-10-08 ENCOUNTER — Other Ambulatory Visit (HOSPITAL_BASED_OUTPATIENT_CLINIC_OR_DEPARTMENT_OTHER)

## 2023-10-08 VITALS — BP 112/68 | HR 73 | Ht 67.0 in | Wt 182.6 lb

## 2023-10-08 DIAGNOSIS — N926 Irregular menstruation, unspecified: Secondary | ICD-10-CM

## 2023-10-08 DIAGNOSIS — N922 Excessive menstruation at puberty: Secondary | ICD-10-CM | POA: Insufficient documentation

## 2023-10-08 DIAGNOSIS — D500 Iron deficiency anemia secondary to blood loss (chronic): Secondary | ICD-10-CM

## 2023-10-08 DIAGNOSIS — N8003 Adenomyosis of the uterus: Secondary | ICD-10-CM

## 2023-10-08 DIAGNOSIS — N888 Other specified noninflammatory disorders of cervix uteri: Secondary | ICD-10-CM

## 2023-10-08 MED ORDER — TRANEXAMIC ACID 650 MG PO TABS: ORAL_TABLET | ORAL | 0 refills | Status: AC

## 2023-10-08 NOTE — Progress Notes (Signed)
    Diane [name] was evaluated in our office on 09/09/23 and was noted to have a large questionable Nabothian cyst on her cervix. She was scheduled for [place] today to confirm cyst is indeed a Nabothian cyst. [place] today did confirm that large cyst on cervix appears to be c/w cervical nabothian cyst.  Pt was evaluated on 09/09/23 due to heavy menstrual periods. She is being treated for anemia. Pt states periods are monthly, regular and "heavy". She has 2 children and hopes to have more children in the future. She and spouse are not currently together and she isn't sexually active at this time. She did decline all hormonal contraception which would be an effort to reduce menorrhagia. States that she tried hormones in the past but they made her gain excessive weight which was difficulty to lose. She is not interested in any type of hormonal contraception at this time. I did encourage her to at least consider Mirena IUD as it would not be likely to cause weight gain but would have good potential of decreasing menorrhagia/blood loss. She will consider Mirena IUD.  [place] (10/08/23): Anteverted uterus appears normal in size. 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 EM 12mm trilaminar, probable collapsing follicle on left. Ovaries appear normal bilaterally with perfusion. Right adnexa wnl. Left adnexa w/ small amt free fluid. Neg cul de sac. Cx wnl with large Nabothian cyst noted.    Past Medical History:  Diagnosis Date   Dysmenorrhea    Medical history non-contributory    Pregnancy induced hypertension     MEDS:   Current Outpatient Medications on File Prior to Visit  Medication Sig Dispense Refill   fluticasone (FLONASE) 50 MCG/ACT nasal spray Place 2 sprays into both nostrils daily.     No current facility-administered medications on file prior to visit.    ALLERGIES: Coconut flavoring agent (non-screening), Fish allergy, and Pineapple  SH:  separated from spouse, 2 children, desires  future childbearing  Review of Systems  Constitutional:  Negative for chills and fever.  Genitourinary:  Negative for dysuria.       + heavy menstrual bleeding  All other systems reviewed and are negative.  PHYSICAL EXAMINATION:    BP 112/68 (BP Location: Right Arm, Patient Position: Sitting, Cuff Size: Large)   Pulse 73   Ht 5\' 7"  (1.702 m) Comment: Reported  Wt 182 lb 9.6 oz (82.8 kg)   LMP 10/03/2023 (Approximate)   BMI 28.60 kg/m     General appearance: alert, cooperative and appears stated age  Assessment/Plan: 1. Nabothian cyst (Primary) - [place] today confirms nabothian cyst present on cervix  2. Excessive menstruation at puberty - Reiterated options for management of menorrhagia - Pt currently declines all hormonal options for menorrhagia but will consider Mirena IUD (hx 2 vaginal deliveries) - She may try Lysteda if desired - US findings reviewed w/ patient and discussed US findings show adenomyosis which may be responsible for menorrhagia.  3. Iron deficiency anemia due to chronic blood loss - Discussed options for management of menorrhagia (pill/patch/NuvaRing/Depo/Mirena). Pt declines all at this time.  RTO 1 year for annual gyn exam and prn if issues arise or if she desires to proceed with insertion Mirena IUD. Today's visit was 20 minutes duration and was 100% 1:1 counseling regarding ultrasound findings, adenomyosis, nabothia cyst, and options for management of menorrhagia.   Letta Kocher

## 2023-10-24 ENCOUNTER — Ambulatory Visit: Payer: Medicaid Other | Admitting: Family Medicine

## 2023-10-24 ENCOUNTER — Encounter: Payer: Self-pay | Admitting: Family Medicine

## 2023-10-24 VITALS — BP 116/68 | HR 88 | Temp 97.1°F | Ht 67.0 in | Wt 172.0 lb

## 2023-10-24 DIAGNOSIS — N921 Excessive and frequent menstruation with irregular cycle: Secondary | ICD-10-CM

## 2023-10-24 DIAGNOSIS — N8003 Adenomyosis of the uterus: Secondary | ICD-10-CM | POA: Diagnosis not present

## 2023-10-24 DIAGNOSIS — D500 Iron deficiency anemia secondary to blood loss (chronic): Secondary | ICD-10-CM

## 2023-10-24 DIAGNOSIS — L089 Local infection of the skin and subcutaneous tissue, unspecified: Secondary | ICD-10-CM | POA: Diagnosis not present

## 2023-10-24 DIAGNOSIS — J301 Allergic rhinitis due to pollen: Secondary | ICD-10-CM

## 2023-10-24 LAB — CBC
HCT: 34.6 % — ABNORMAL LOW (ref 35.0–45.0)
Hemoglobin: 10.8 g/dL — ABNORMAL LOW (ref 11.7–15.5)
MCH: 25.4 pg — ABNORMAL LOW (ref 27.0–33.0)
MCHC: 31.2 g/dL — ABNORMAL LOW (ref 32.0–36.0)
MCV: 81.4 fL (ref 80.0–100.0)
MPV: 10.1 fL (ref 7.5–12.5)
Platelets: 325 10*3/uL (ref 140–400)
RBC: 4.25 10*6/uL (ref 3.80–5.10)
WBC: 4 10*3/uL (ref 3.8–10.8)

## 2023-10-24 MED ORDER — FLUTICASONE PROPIONATE 50 MCG/ACT NA SUSP: 2.0000 | Freq: Every day | NASAL | 5 refills | Status: AC

## 2023-10-24 MED ORDER — CETIRIZINE HCL 10 MG PO TABS
10.0000 mg | ORAL_TABLET | Freq: Every day | ORAL | 11 refills | Status: AC
Start: 2023-10-24 — End: ?

## 2023-10-24 MED ORDER — ONDANSETRON 4 MG PO TBDP: 4.0000 mg | ORAL_TABLET | Freq: Three times a day (TID) | ORAL | 3 refills | Status: DC | PRN

## 2023-10-24 MED ORDER — CEPHALEXIN 500 MG PO CAPS: 500.0000 mg | ORAL_CAPSULE | Freq: Four times a day (QID) | ORAL | 0 refills | Status: DC

## 2023-10-24 NOTE — Telephone Encounter (Signed)
 Form printed and placed in providers desk. Dm/cma

## 2023-10-24 NOTE — Assessment & Plan Note (Signed)
-  As above.

## 2023-10-24 NOTE — Assessment & Plan Note (Signed)
 She should continue her Flonase  spray daily and olopatadine drops. I will add a daily antihistamine.

## 2023-10-24 NOTE — Assessment & Plan Note (Signed)
 Discussed her diagnosis. Hysterectomy would be the definitive treatment to stop her excessive blood loss and to stop her associated pain. She is conflicted about this out of a desire to have more children. I recommended she talk to her family when she is in [place] to help her make a decision. In the meantime, I recommend she reach out to [name] regarding the lack of response to Lysteda . She should use ibuprofen  for pain associated with menses and I will prescribe Zofran  for nausea. I will complete FMLA paperwork on her behalf.

## 2023-10-24 NOTE — Progress Notes (Signed)
 [facility] [address] [place] [address] Dept: [phone] Dept Fax: [phone]  Chronic Care Office Visit  Subjective:    Patient ID: Diane [name], female    DOB: 08/13/1990, 33 y.o..   MRN: [id number]  Chief Complaint  Patient presents with   Follow-up    2 month f/u.   Low abdominal pain and low back pain.  Tylenol  taken.     History of Present Illness:  Patient is in today for reassessment of chronic medical issues.  Ms. 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 She did go to hematology and had iron  infusions to try and address her iron -deficiency. She was recently seen by [name], CNM related to this. Ms. Hilda Lovings has recommended consideration of hysterectomy. However, Ms. Bokhari would like to have more children. She does have pain associated with her menses and notes these have been heavier with time. She was prescribed tranexamic acid , but feels this has actually increased her bleeding. She feels very torn emotionally about deciding to move ahead with a hysterectomy. She is finding pain and nausea associated with her menses is interfering with her ability to work. Her employer told her to consider temporary disability.   Ms. Atilano has a history of allergic rhinitis. She is currently using Flonase  nasal spray and olopatadine eye drops for managing her symptoms. She is finding this does not fully control her symptoms.  Ms. Jarrett is planning a trip to [place]. She notes that when she is there, she often gets multiple mosquito bites, some of which become infected. She asks about having an antibiotic available to take for such occasions.  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     tranexamic acid  (LYSTEDA ) 650 MG TABS tablet Take 2 tablets three times daily for up to 2 to 3 days during heavy bleeding 30 tablet 0   No facility-administered medications prior to visit.   Allergies  Allergen Reactions   Coconut Flavoring Agent (Non-Screening) Rash   Fish Allergy Rash    "Mackeral" only   Pineapple Rash   Objective:   Today's Vitals   10/24/23 1515  BP: 116/68  Pulse: 88  Temp: (!) 97.1 F (36.2 C)  TempSrc: Temporal  SpO2: 100%  Weight: 172 lb (78 kg)  Height: 5\' 7"  (1.702 m)   Body mass index is 26.94 kg/m.   General: Well developed, well nourished. No acute distress. Psych: Alert and oriented. Sad mood and affect with tearfulness related to discussions of possible hysterectomy.  There are no preventive care reminders to display for this patient.  Lab Results    Latest Ref Rng & Units 08/23/2023    9:31 AM 07/18/2023  4:33 PM 05/30/2023    4:12 PM  CBC  WBC 4.0 - 10.5 K/uL 3.5  3.8  5.4   Hemoglobin 12.0 - 15.0 g/dL 7.8  7.9  8.0   Hematocrit 36.0 - 46.0 % 26.3  27.4  26.9   Platelets 150 - 400 K/uL 403  513  514       Assessment & Plan:   Problem List Items Addressed This Visit       Respiratory   Allergic rhinitis due to pollen   She should continue her Flonase  spray daily and olopatadine drops. I will add a daily antihistamine.      Relevant Medications   cetirizine (ZYRTEC) 10 MG  tablet   fluticasone  (FLONASE ) 50 MCG/ACT nasal spray     Genitourinary   Adenomyosis - Primary   Discussed her diagnosis. Hysterectomy would be the definitive treatment to stop her excessive blood loss and to stop her associated pain. She is conflicted about this out of a desire to have more children. I recommended she talk to her family when she is in [place] to help her make a decision. In the meantime, I recommend she reach out to [name] regarding the lack of response to Lysteda . She should use ibuprofen  for pain associated with menses and I will prescribe Zofran  for nausea. I will complete FMLA paperwork on her behalf.      Relevant Medications   ondansetron  (ZOFRAN -ODT) 4 MG disintegrating tablet     Other   Iron  deficiency anemia due to chronic blood loss   I will reassess a CBC and iron  profile today.      Relevant Orders   Iron , TIBC and Ferritin Panel   CBC   Menometrorrhagia   As above.      Other Visit Diagnoses       Skin infection       I will provide a prescription for cephalexin for her to have on hand should she develop a skin infeciton while in [place].   Relevant Medications   cephALEXin (KEFLEX) 500 MG capsule       Return in about 2 months (around 12/24/2023) for Reassessment.   Graig Lawyer, MD

## 2023-10-24 NOTE — Assessment & Plan Note (Signed)
 I will reassess a CBC and iron  profile today.

## 2023-10-25 LAB — IRON,TIBC AND FERRITIN PANEL
%SAT: 14 % — ABNORMAL LOW (ref 16–45)
Ferritin: 29 ng/mL (ref 16–154)
Iron: 47 ug/dL (ref 40–190)
TIBC: 328 ug/dL (ref 250–450)

## 2023-10-28 ENCOUNTER — Telehealth: Payer: Self-pay

## 2023-10-28 NOTE — Telephone Encounter (Signed)
 FMLA form filled out by provider and then faxed to [employer] @ [phone].  Called patient and mailing her a copy to home address. Dm/cma

## 2023-11-10 ENCOUNTER — Encounter: Payer: Self-pay | Admitting: Family Medicine

## 2023-12-09 ENCOUNTER — Ambulatory Visit: Payer: Self-pay

## 2023-12-09 NOTE — Telephone Encounter (Signed)
 FYI Only or Action Required?: FYI only for provider  Patient was last seen in primary care on 10/24/2023 by Graig Lawyer, MD. Called Nurse Triage reporting Rash. Symptoms began several days ago. Interventions attempted: Rest, hydration, or home remedies. Symptoms are: gradually worsening.  Triage Disposition: See Physician Within 24 Hours  Patient/caregiver understands and will follow disposition?: Yes                   Copied from CRM [id number]. Topic: Clinical - Red Word Triage >> Dec 09, 2023  4:07 PM Diane [name] wrote: [facility] that prompted transfer to Nurse Triage: Patient went to [place] and is now having bumps appear all over her body that are very painful. She said the bumps on her leg are giving her severe pain. Reason for Disposition  SEVERE itching (i.e., interferes with sleep, normal activities or school)  Answer Assessment - Initial Assessment Questions 1. APPEARANCE of RASH: "Describe the rash." (e.g., spots, blisters, raised areas, skin peeling, scaly)     "Bumps all over her body"; looks like mosquito bites/pimples 2. SIZE: "How big are the spots?" (e.g., tip of pen, eraser, coin; inches, centimeters)     "Like a normal pimple" 3. LOCATION: "Where is the rash located?"     Leg, arms 4. COLOR: "What color is the rash?" (Note: It is difficult to assess rash color in people with darker-colored skin. When this situation occurs, simply ask the caller to describe what they see.)     Dark-colored skin 5. ONSET: "When did the rash begin?"     "I already had it in [place]" 6. FEVER: "Do you have a fever?" If Yes, ask: "What is your temperature, how was it measured, and when did it start?"     States she had a fever earlier, none now 7. ITCHING: "Does the rash itch?" If Yes, ask: "How bad is the itch?" (Scale 1-10; or mild, moderate, severe)     Yes  8. CAUSE: "What do you think is causing the rash?"     Not sure  9. MEDICINE FACTORS: "Have you started any new  medicines within the last 2 weeks?" (e.g., antibiotics)       10. OTHER SYMPTOMS: "Do you have any other symptoms?" (e.g., dizziness, headache, sore throat, joint pain)       Returned from [place] last Friday -- pt states she thinks she had Malaria in [place] and was treated for it, but seems unsure about that diagnosis   10/10 pain, difficulty walking  Denies drainage. "I had a fever earlier"   Denies dizziness/weakness   Denies flu-like symptoms. Denies neck stiffness  Protocols used: Rash or Redness - [facility]

## 2023-12-10 ENCOUNTER — Other Ambulatory Visit: Payer: Self-pay | Admitting: Internal Medicine

## 2023-12-10 ENCOUNTER — Ambulatory Visit (INDEPENDENT_AMBULATORY_CARE_PROVIDER_SITE_OTHER): Admitting: Family Medicine

## 2023-12-10 ENCOUNTER — Encounter: Payer: Self-pay | Admitting: Family Medicine

## 2023-12-10 VITALS — BP 110/66 | HR 70 | Temp 97.6°F | Ht 67.0 in | Wt 181.8 lb

## 2023-12-10 DIAGNOSIS — M2550 Pain in unspecified joint: Secondary | ICD-10-CM

## 2023-12-10 DIAGNOSIS — R238 Other skin changes: Secondary | ICD-10-CM | POA: Diagnosis not present

## 2023-12-10 DIAGNOSIS — Z789 Other specified health status: Secondary | ICD-10-CM | POA: Diagnosis not present

## 2023-12-10 LAB — COMPREHENSIVE METABOLIC PANEL WITH GFR
ALT: 10 U/L (ref 0–35)
AST: 11 U/L (ref 0–37)
Albumin: 3.6 g/dL (ref 3.5–5.2)
Alkaline Phosphatase: 63 U/L (ref 39–117)
BUN: 8 mg/dL (ref 6–23)
CO2: 28 meq/L (ref 19–32)
Calcium: 8.9 mg/dL (ref 8.4–10.5)
Chloride: 105 meq/L (ref 96–112)
Creatinine, Ser: 0.64 mg/dL (ref 0.40–1.20)
GFR: 115.8 mL/min (ref >60.00)
Glucose, Bld: 88 mg/dL (ref 70–99)
Potassium: 3.9 meq/L (ref 3.5–5.1)
Sodium: 138 meq/L (ref 135–145)
Total Bilirubin: 0.9 mg/dL (ref 0.2–1.2)
Total Protein: 6.8 g/dL (ref 6.0–8.3)

## 2023-12-10 LAB — CBC WITH DIFFERENTIAL/PLATELET
Basophils Absolute: 0.1 10*3/uL (ref 0.0–0.1)
Basophils Relative: 1.1 % (ref 0.0–3.0)
Eosinophils Absolute: 0.1 10*3/uL (ref 0.0–0.7)
Eosinophils Relative: 1.1 % (ref 0.0–5.0)
HCT: 30 % — ABNORMAL LOW (ref 36.0–46.0)
Hemoglobin: 10.1 g/dL — ABNORMAL LOW (ref 12.0–15.0)
Lymphocytes Relative: 18.9 % (ref 12.0–46.0)
Lymphs Abs: 1.3 10*3/uL (ref 0.7–4.0)
MCHC: 33.7 g/dL (ref 30.0–36.0)
MCV: 85.4 fl (ref 78.0–100.0)
Monocytes Absolute: 0.6 10*3/uL (ref 0.1–1.0)
Monocytes Relative: 9.3 % (ref 3.0–12.0)
Neutro Abs: 4.7 10*3/uL (ref 1.4–7.7)
Neutrophils Relative %: 69.6 % (ref 43.0–77.0)
Platelets: 383 10*3/uL (ref 150.0–400.0)
RBC: 3.51 Mil/uL — ABNORMAL LOW (ref 3.87–5.11)
RDW: 18.4 % — ABNORMAL HIGH (ref 11.5–15.5)
WBC: 6.7 10*3/uL (ref 4.0–10.5)

## 2023-12-10 MED ORDER — NAPROXEN 500 MG PO TABS: 500.0000 mg | ORAL_TABLET | Freq: Two times a day (BID) | ORAL | 0 refills | Status: DC

## 2023-12-10 NOTE — Patient Instructions (Addendum)
 [facility]  [address] [place], [place] [address]  Phone: [phone]

## 2023-12-10 NOTE — Progress Notes (Signed)
 [facility] [address] [place] [address] Dept: [phone] Dept Fax: [phone]  Office Visit  Subjective:    Patient ID: Diane [name], female    DOB: 01-11-1991, 33 y.o..   MRN: [id number]  Chief Complaint  Patient presents with   Pain    C/o having joint pain,  no appetite, HA off/on x 1 week.   Was diagnosed with Typhoid fever & Malaria.    History of Present Illness:  Patient is in today complaining of a painful rash, headache, poor appetite, and diffuse joint aches. Diane [name] returned this weekend from the [place]. She had been there for the past month to visit family. She notes that while there, she became ill. She was seen by a doctor and was diagnosed with malaria and typhoid fever. She received multiple injections into her left thigh, but is unclear as to what the medication. The current symptoms have been occurring over the past 5-7 days. She notes that some of the skin lesions have opened up and are draining fluid. These are present on her trunk and extremities. She also notes achiness in her groin/proximal thighs. No one at home has any rash or is sick. She is not scheduled to return to work until 6/16.  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 30 tablet 11   fluticasone  (FLONASE ) 50 MCG/ACT nasal spray Place 2 sprays into both nostrils daily. 11.1 mL 5   ondansetron  (ZOFRAN -ODT) 4 MG disintegrating tablet Take 1 tablet (4 mg total) by mouth every 8 (eight) hours as needed. 20 tablet 3   tranexamic acid  (LYSTEDA ) 650 MG TABS tablet Take 2 tablets three times daily for up to 2 to 3 days during heavy bleeding 30 tablet 0   cephALEXin  (KEFLEX ) 500 MG capsule Take 1 capsule (500 mg total) by mouth 4 (four) times daily. 28 capsule 0   No facility-administered medications prior to visit.   Allergies  Allergen Reactions   Coconut Flavoring Agent (Non-Screening) Rash   Fish Allergy Rash    Mackeral only   Pineapple Rash     Objective:   Today's Vitals   12/10/23 0919  BP: 110/66  Pulse: 70  Temp: 97.6 F (36.4 C)  TempSrc: Temporal  SpO2: 98%  Weight: 181 lb 12.8 oz (82.5 kg)  Height: 5' 7 (1.702 m)   Body mass index is 28.47 kg/m.   General: Well developed, well nourished. No acute distress. Extremities: Mild swelling noted of the knees and the lower legs. There is tenderness to palpation of   the upper thigh near the inguinal area. Skin: There are diffuse papules and vesicular lesions noted on all extremities, including hands and   feet, and  on the trunk.            Psych: Alert and oriented. Normal mood and affect.  There are no preventive care reminders to display for this patient.    Assessment & Plan:  1. Vesicular rash (Primary) 2. Arthralgia, unspecified joint 3. History of recent travel- [place]  Diane [name] has a diffuse papular and vesicular rash on the trunk and extremities, associated with systemic symptoms of headache and diffuse arthralgias. This has occurred after travel tot he [place]. I spoke with Dr. Liane Redman (infectious disease). We discussed possible chickenpox vs. monkeypox. Ms.  [name] thinks she may have had chickenpox in childhood. Monkeypox remains a distinct possibility due to the presentation and the history of recent travel to [place]. I will order initial testing as recommended by Dr. Levern Reader. Diane [name] is scheduled to be seen tomorrow morning at 0845 hrs by Dr. Norita Beauvais at [facility].  - Monkeypox Virus DNA, Qualitative Real-Time PCR - Varicella Zoster Virus (VZV) DNA, Quantitative Real-Time PCR(CSF) - CBC with Differential/Platelet - Comprehensive metabolic panel with GFR - Varicella zoster antibody, IgG - naproxen  (NAPROSYN ) 500 MG tablet; Take 1 tablet (500 mg total) by mouth 2 (two) times daily with a meal.  Dispense: 14 tablet; Refill: 0 - Ambulatory referral to Infectious Disease  Return for Follow-up as scheduled.   Graig Lawyer, MD

## 2023-12-11 ENCOUNTER — Ambulatory Visit: Payer: Self-pay

## 2023-12-11 ENCOUNTER — Telehealth: Payer: Self-pay

## 2023-12-11 ENCOUNTER — Other Ambulatory Visit: Payer: Self-pay

## 2023-12-11 ENCOUNTER — Ambulatory Visit: Admitting: Internal Medicine

## 2023-12-11 ENCOUNTER — Other Ambulatory Visit

## 2023-12-11 ENCOUNTER — Encounter: Payer: Self-pay | Admitting: Internal Medicine

## 2023-12-11 VITALS — BP 103/68 | HR 93 | Temp 99.1°F

## 2023-12-11 DIAGNOSIS — R21 Rash and other nonspecific skin eruption: Secondary | ICD-10-CM | POA: Diagnosis not present

## 2023-12-11 DIAGNOSIS — L989 Disorder of the skin and subcutaneous tissue, unspecified: Secondary | ICD-10-CM | POA: Diagnosis not present

## 2023-12-11 DIAGNOSIS — R238 Other skin changes: Secondary | ICD-10-CM

## 2023-12-11 DIAGNOSIS — Z789 Other specified health status: Secondary | ICD-10-CM

## 2023-12-11 DIAGNOSIS — M2550 Pain in unspecified joint: Secondary | ICD-10-CM

## 2023-12-11 MED ORDER — CALAMINE PLUS 1-8 % EX LOTN: TOPICAL_LOTION | CUTANEOUS | 0 refills | Status: AC

## 2023-12-11 MED ORDER — HYDROXYZINE HCL 25 MG PO TABS: 25.0000 mg | ORAL_TABLET | Freq: Three times a day (TID) | ORAL | 0 refills | Status: AC | PRN

## 2023-12-11 NOTE — Telephone Encounter (Signed)
 Patient is coming in today @ 3:20 pm. Dm/cma

## 2023-12-11 NOTE — Telephone Encounter (Signed)
 Received a call from [facility] stating that the swab done for Monkey pox DNA was not handled properly and will need to be recollected.   Spoke to Dr Tilmon Font and he advises to have the patient come back for a nurse/lab appt to recollect the swab.   Called patient and scheduled her to come back at 3:20 pm.    She will come in the back door.  Dm/cma

## 2023-12-11 NOTE — Progress Notes (Signed)
 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   FLUTICASONE  (FLONASE ) 50 MCG/ACT NASAL SPRAY    Place 2 sprays into both nostrils daily.   NAPROXEN  (NAPROSYN ) 500 MG TABLET    Take 1 tablet (500 mg total) by mouth 2 (two) times daily with a meal.   ONDANSETRON  (ZOFRAN -ODT) 4 MG DISINTEGRATING TABLET    Take 1 tablet (4 mg total) by mouth every 8 (eight) hours as needed.   TRANEXAMIC ACID  (LYSTEDA ) 650 MG TABS TABLET    Take 2 tablets three times daily for up to 2 to 3 days during heavy bleeding  Modified Medications   No medications on file  Discontinued Medications   No medications on file    Subjective:   33 year old female with past medical history as below referred from family practice office Dr. Jacquetta Mattocks in regards to concern for monkeypox.  She had complaint of painful rash, headache, poor appetite and diffuse aches.  She had been there for the past month to visit family.  Patient became ill she was seen by doctor and diagnosed with malaria and typhoid fever.  She had mitral injection on the left thigh but was unclear as to what medication.  The current symptoms have been occurring for the past 5 to 7 days notes some of her lesions have opened up and been draining.  She notes achiness in thigh.  At the office HIV was done which was negative.  The lesions were swabbed for Today: 1PT has was in [place] a [place]1 month, returned last  weeks. Not sexaully acitve x 1 yar [place]  farm [place]. Had dog and cat No known sick contacts. \Pt staes she has to boil water to shower in [place]. About 2 weeks ago body was itching. [place] she started having  pimples popping out. Theya re really itchy. No fevers ofr chills.  A bit nausea PT ahas allergies, . [facility]: 26typhif malaria, rx no rx. Body aches,  ha, nausea Kids1 year- 7 years. precuations Review of Systems: Review of Systems  All other systems reviewed and are negative.   Past Medical History:  Diagnosis Date   Dysmenorrhea    Medical history non-contributory    Pregnancy induced hypertension     Social History   Tobacco Use   Smoking status: Never   Smokeless tobacco: Never  Vaping Use   Vaping status: Never Used  Substance Use Topics   Alcohol use: No    Alcohol/week: 0.0 standard drinks of alcohol   Drug use: No    Comment: Discussed birth control options    Family History  Problem Relation Age of Onset   Hypertension Mother     Allergies  Allergen Reactions   Coconut Flavoring Agent (Non-Screening) Rash   Fish Allergy Rash    Mackeral only   Pineapple Rash  Health Maintenance  Topic Date Due   HPV VACCINES (1 - 3-dose series) Never done   COVID-19 Vaccine (1 - 2024-25 season) 12/26/2023 (Originally 03/02/2023)   INFLUENZA VACCINE  01/30/2024   Cervical Cancer Screening (HPV/Pap Cotest)  08/14/2028   DTaP/Tdap/Td (6 - Td or Tdap) 07/17/2033   Hepatitis C Screening  Completed   HIV Screening  Completed   Meningococcal B Vaccine  Aged Out    Objective:  There were no vitals filed for this visit. There is no height or weight on file to calculate BMI.  Physical Exam Constitutional:      Appearance: Normal appearance.  HENT:     Head: Normocephalic and atraumatic.     Right Ear: Tympanic membrane normal.     Left Ear: Tympanic membrane normal.     Nose: Nose normal.     Mouth/Throat:      Mouth: Mucous membranes are moist.   Eyes:     Extraocular Movements: Extraocular movements intact.     Conjunctiva/sclera: Conjunctivae normal.     Pupils: Pupils are equal, round, and reactive to light.    Cardiovascular:     Rate and Rhythm: Normal rate and regular rhythm.     Heart sounds: No murmur heard.    No friction rub. No gallop.  Pulmonary:     Effort: Pulmonary effort is normal.     Breath sounds: Normal breath sounds.  Abdominal:     General: Abdomen is flat.     Palpations: Abdomen is soft.   Musculoskeletal:        General: Normal range of motion.   Skin:    General: Skin is warm and dry.   Neurological:     General: No focal deficit present.     Mental Status: She is alert and oriented to person, place, and time.   Psychiatric:        Mood and Affect: Mood normal.    Physical Exam   Lab Results Lab Results  Component Value Date   WBC 6.7 12/10/2023   HGB 10.1 (L) 12/10/2023   HCT 30.0 (L) 12/10/2023   MCV 85.4 12/10/2023   PLT 383.0 12/10/2023    Lab Results  Component Value Date   CREATININE 0.64 12/10/2023   BUN 8 12/10/2023   NA 138 12/10/2023   K 3.9 12/10/2023   CL 105 12/10/2023   CO2 28 12/10/2023    Lab Results  Component Value Date   ALT 10 12/10/2023   AST 11 12/10/2023   ALKPHOS 63 12/10/2023   BILITOT 0.9 12/10/2023    No results found for: CHOL, HDL, LDLCALC, LDLDIRECT, TRIG, CHOLHDL Lab Results  Component Value Date   LABRPR Non Reactive 04/13/2016   No results found for: HIV1RNAQUANT, HIV1RNAVL, CD4TABS   Problem List Items Addressed This Visit   None  Results   Assessment/Plan #Lesions c/f monkey pox-urgent referral -Pt had malaise while in [place]. She was hospitalized a couple night for possible malaria /typhoid. Then about a week ago developed pruiritic vesicular rash.  -Follow up PCP testing swab for( monkey pox, vzv). Stable labs-> cbbc and cmp -Benadryl  and calamine  lotion for symptomatic management -Keep lesions covered. Her family members are asymptomatic.  -Pt notes chickenpox in her teens -F.U next week to review labs and symptoms      Orlie Bjornstad, MD [facility] 12/11/2023, 7:23 AM   I have personally spent  75 minutes involved in  face-to-face and non-face-to-face activities for this patient on the day of the visit. Professional time spent includes the following activities: Preparing to see the patient (review of tests), Obtaining and/or reviewing separately obtained history (admission/discharge record), Performing a medically appropriate examination and/or evaluation , Ordering medications/tests/procedures, referring and communicating with other health care professionals, Documenting clinical information in the EMR, Independently interpreting results (not separately reported), Communicating results to the patient/family/caregiver, Counseling and educating the patient/family/caregiver and Care coordination (not separately reported).

## 2023-12-12 ENCOUNTER — Telehealth: Payer: Self-pay

## 2023-12-12 NOTE — Telephone Encounter (Signed)
 Patient is needing a note for her to be out of work till this situation gets better.  Please review and advise.  Thanks. Dm/cma

## 2023-12-12 NOTE — Telephone Encounter (Signed)
 Letter printed and faxed to [employer] at [phone].  Patient wants the letter uploaded to her through my chart.  Dm/cma

## 2023-12-15 ENCOUNTER — Telehealth: Payer: Self-pay

## 2023-12-15 ENCOUNTER — Ambulatory Visit: Payer: Self-pay | Admitting: Family Medicine

## 2023-12-15 LAB — MONKEYPOX VIRUS DNA, QUALITATIVE REAL-TIME PCR
MONKEYPOX VIRUS DNA, QL PCR: NOT DETECTED
Orthopoxvirus DNA, QL PCR: NOT DETECTED

## 2023-12-15 NOTE — Telephone Encounter (Signed)
 Copied from CRM [id number]. Topic: General - Other >> Dec 15, 2023  3:37 PM Trula Gable C wrote: Reason for CRM: Patient called in wanting to know the status of the return to work letter , would like for Diane [name] to give her a callback regarding this

## 2023-12-15 NOTE — Telephone Encounter (Signed)
 See other message.  Will call her in the morning. Dm/cma

## 2023-12-16 ENCOUNTER — Ambulatory Visit: Payer: Self-pay | Admitting: Internal Medicine

## 2023-12-16 ENCOUNTER — Other Ambulatory Visit: Payer: Self-pay

## 2023-12-16 DIAGNOSIS — Z8619 Personal history of other infectious and parasitic diseases: Secondary | ICD-10-CM

## 2023-12-16 DIAGNOSIS — L03119 Cellulitis of unspecified part of limb: Secondary | ICD-10-CM

## 2023-12-16 DIAGNOSIS — R21 Rash and other nonspecific skin eruption: Secondary | ICD-10-CM

## 2023-12-16 DIAGNOSIS — R197 Diarrhea, unspecified: Secondary | ICD-10-CM

## 2023-12-16 DIAGNOSIS — B54 Unspecified malaria: Secondary | ICD-10-CM

## 2023-12-16 DIAGNOSIS — R112 Nausea with vomiting, unspecified: Secondary | ICD-10-CM | POA: Diagnosis not present

## 2023-12-16 LAB — CBC WITH DIFFERENTIAL/PLATELET
Absolute Lymphocytes: 1454 {cells}/uL (ref 850–3900)
Absolute Monocytes: 394 {cells}/uL (ref 200–950)
Basophils Absolute: 29 {cells}/uL (ref 0–200)
Basophils Relative: 0.6 %
Eosinophils Absolute: 110 {cells}/uL (ref 15–500)
Eosinophils Relative: 2.3 %
HCT: 34.1 % — ABNORMAL LOW (ref 35.0–45.0)
Hemoglobin: 10.7 g/dL — ABNORMAL LOW (ref 11.7–15.5)
MCH: 28.2 pg (ref 27.0–33.0)
MCHC: 31.4 g/dL — ABNORMAL LOW (ref 32.0–36.0)
MCV: 89.7 fL (ref 80.0–100.0)
MPV: 9.1 fL (ref 7.5–12.5)
Monocytes Relative: 8.2 %
Neutro Abs: 2813 {cells}/uL (ref 1500–7800)
Neutrophils Relative %: 58.6 %
Platelets: 332 10*3/uL (ref 140–400)
RBC: 3.8 10*6/uL (ref 3.80–5.10)
RDW: 16 % — ABNORMAL HIGH (ref 11.0–15.0)
Total Lymphocyte: 30.3 %
WBC: 4.8 10*3/uL (ref 3.8–10.8)

## 2023-12-16 LAB — TIQ-NTM

## 2023-12-16 LAB — VARICELLA ZOSTER ANTIBODY, IGG: Varicella IgG: 5 {s_co_ratio}

## 2023-12-16 LAB — MONKEYPOX VIRUS DNA, QUALITATIVE REAL-TIME PCR

## 2023-12-16 MED ORDER — LINEZOLID 600 MG PO TABS: 600.0000 mg | ORAL_TABLET | Freq: Two times a day (BID) | ORAL | 0 refills | Status: AC

## 2023-12-16 MED ORDER — AMOXICILLIN-POT CLAVULANATE 875-125 MG PO TABS: 1.0000 | ORAL_TABLET | Freq: Two times a day (BID) | ORAL | 0 refills | Status: AC

## 2023-12-16 NOTE — Telephone Encounter (Signed)
 Patient notified VIA phone that letter was uploaded to my chart for her.  Dm/cma

## 2023-12-16 NOTE — Progress Notes (Unsigned)
 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   DIPHENHYDRAMINE -CALAMINE (CALAMINE PLUS) 1-8 % LOTN    Apply to affected area PRN   FLUTICASONE  (FLONASE ) 50 MCG/ACT NASAL SPRAY    Place 2 sprays into both nostrils daily.   HYDROXYZINE  (ATARAX ) 25 MG TABLET    Take 1 tablet (25 mg total) by mouth 3 (three) times daily as needed.   NAPROXEN  (NAPROSYN ) 500 MG TABLET    Take 1 tablet (500 mg total) by mouth 2 (two) times daily with a meal.   ONDANSETRON  (ZOFRAN -ODT) 4 MG DISINTEGRATING TABLET    Take 1 tablet (4 mg total) by mouth every 8 (eight) hours as needed.   TRANEXAMIC ACID  (LYSTEDA ) 650 MG TABS TABLET    Take 2 tablets three times daily for up to 2 to 3 days during heavy bleeding  Modified Medications   No medications on file  Discontinued Medications   No medications on file    Subjective: ***  Today @DATE @ : Discussed the use of AI scribe software for clinical note transcription with the patient, who gave verbal consent to proceed.  History of Present Illness     Review of Systems: ROS  Past Medical History:  Diagnosis Date   Dysmenorrhea    Medical history non-contributory    Pregnancy induced hypertension     Social History   Tobacco Use   Smoking status: Never   Smokeless tobacco: Never  Vaping Use   Vaping status: Never Used  Substance Use Topics   Alcohol use: No    Alcohol/week: 0.0 standard drinks of  alcohol   Drug use: No    Comment: Discussed birth control options    Family History  Problem Relation Age of Onset   Hypertension Mother     Allergies  Allergen Reactions   Coconut Flavoring Agent (Non-Screening) Rash   Fish Allergy Rash    Mackeral only   Pineapple Rash    Health Maintenance  Topic Date Due   HPV VACCINES (1 - 3-dose series) Never done   COVID-19 Vaccine (1 - 2024-25 season) 12/26/2023 (Originally 03/02/2023)   INFLUENZA VACCINE  01/30/2024   Cervical Cancer Screening (HPV/Pap Cotest)  08/14/2028   DTaP/Tdap/Td (6 - Td or Tdap) 07/17/2033   Hepatitis C Screening  Completed   HIV Screening  Completed   Meningococcal B Vaccine  Aged Out    Objective:  There were no vitals filed for this visit. There is no height or weight on file to calculate BMI.  Physical Exam Physical Exam   Lab Results Lab Results  Component Value Date   WBC 6.7 12/10/2023   HGB 10.1 (L) 12/10/2023   HCT 30.0 (L) 12/10/2023   MCV 85.4 12/10/2023   PLT 383.0 12/10/2023    Lab Results  Component Value Date   CREATININE 0.64 12/10/2023   BUN 8 12/10/2023  NA 138 12/10/2023   K 3.9 12/10/2023   CL 105 12/10/2023   CO2 28 12/10/2023    Lab Results  Component Value Date   ALT 10 12/10/2023   AST 11 12/10/2023   ALKPHOS 63 12/10/2023   BILITOT 0.9 12/10/2023    No results found for: CHOL, HDL, LDLCALC, LDLDIRECT, TRIG, CHOLHDL Lab Results  Component Value Date   LABRPR Non Reactive 04/13/2016   No results found for: HIV1RNAQUANT, HIV1RNAVL, CD4TABS   Problem List Items Addressed This Visit   None  Results   Assessment/Plan Assessment and Plan Assessment & Plan    Call office on Friday.  -labs swabs of left foot lesion. All lesion healed. She thinks sh emaybe getting some righ tfoot but htat but nothing seen.      Orlie Bjornstad, MD [facility] 12/16/2023, 9:45 AM m

## 2023-12-17 ENCOUNTER — Other Ambulatory Visit

## 2023-12-18 ENCOUNTER — Other Ambulatory Visit: Payer: Self-pay

## 2023-12-18 ENCOUNTER — Other Ambulatory Visit

## 2023-12-18 DIAGNOSIS — R21 Rash and other nonspecific skin eruption: Secondary | ICD-10-CM

## 2023-12-18 LAB — MALARIA SMEAR

## 2023-12-18 LAB — HERPES SIMPLEX VIRUS, TYPE 1 AND 2 DNA,QUAL,RT PCR
HSV 1 DNA: NOT DETECTED
HSV 2 DNA: NOT DETECTED

## 2023-12-19 LAB — C. DIFFICILE GDH AND TOXIN A/B

## 2023-12-19 NOTE — Telephone Encounter (Signed)
 Copied from CRM [id number]. Topic: General - Other >> Dec 19, 2023  3:14 PM [name] wrote: Reason for CRM: Pt called in stating she got a doctor note to return to work on Monday 12/22/2023 but states she is still hurting and limping and her leg is not feeling well enough to return to work. Wants to know if she can have another week off of work to see if her pain feels better.  Pt also states she found out through MyChart that she was tested for HIV without her permission. She wants to know why this was not communicated with her. She is very offended by this and wants to know why this happened. She is very insulted by this.  Can office manager. PCP, or clinical staff please call patient back [phone] to discuss and help patient understand.

## 2023-12-19 NOTE — Telephone Encounter (Signed)
 Spoke to patient, advised that I will talk to provider on Monday 12/22/23.  Dm/cma

## 2023-12-22 LAB — GASTROINTESTINAL PATHOGEN PNL
CampyloBacter Group: NOT DETECTED
Norovirus GI/GII: NOT DETECTED
Rotavirus A: NOT DETECTED
Salmonella species: NOT DETECTED
Shiga Toxin 1: NOT DETECTED
Shiga Toxin 2: NOT DETECTED
Shigella Species: NOT DETECTED
Vibrio Group: NOT DETECTED
Yersinia enterocolitica: NOT DETECTED

## 2023-12-22 NOTE — Telephone Encounter (Signed)
 Patient notified VIA phone and will print off letter from my chart.  Will keep the signed copy for her to pick up at her appointment this week. Dm/cma

## 2023-12-23 LAB — C. DIFFICILE GDH AND TOXIN A/B
GDH ANTIGEN: NOT DETECTED
MICRO NUMBER:: 16602916
SPECIMEN QUALITY:: ADEQUATE
TOXIN A AND B: NOT DETECTED

## 2023-12-23 LAB — VARICELLA-ZOSTER BY PCR: VARICELLA ZOSTER VIRUS (VZV) DNA, QL RT PCR: NOT DETECTED

## 2023-12-23 LAB — OVA AND PARASITE EXAMINATION
CONCENTRATE RESULT:: NONE SEEN
MICRO NUMBER:: 16602066
SPECIMEN QUALITY:: ADEQUATE
TRICHROME RESULT:: NONE SEEN

## 2023-12-24 ENCOUNTER — Ambulatory Visit (INDEPENDENT_AMBULATORY_CARE_PROVIDER_SITE_OTHER): Admitting: Family Medicine

## 2023-12-24 VITALS — BP 124/68 | HR 90 | Temp 97.8°F | Ht 67.0 in | Wt 187.0 lb

## 2023-12-24 DIAGNOSIS — R238 Other skin changes: Secondary | ICD-10-CM | POA: Diagnosis not present

## 2023-12-24 DIAGNOSIS — N921 Excessive and frequent menstruation with irregular cycle: Secondary | ICD-10-CM

## 2023-12-24 DIAGNOSIS — N8003 Adenomyosis of the uterus: Secondary | ICD-10-CM

## 2023-12-24 NOTE — Progress Notes (Signed)
 [facility] [address] [place] [address] Dept: [phone] Dept Fax: [phone]  Office Visit  Subjective:    Patient ID: Diane [name], female    DOB: [birth year], 33 y.o..   MRN: [id number]  Chief Complaint  Patient presents with   Follow-up    F/u legs and ? About having heavy periods   History of Present Illness:  Patient is in today for follow-up regarding her recent rash. I saw Diane [name] on 12/10/2023. She had complained of a painful rash, headache, poor appetite, and diffuse joint aches. Diane [name] returned a few days prior from a month long trip to the [place]. She noted that while there, she became ill. She was seen by a doctor and was diagnosed with malaria and typhoid fever. She received multiple injections into her left thigh. The symptoms present at that time had been occurring over the prior 5-7 days. She noted that some of the skin lesions have opened up and were draining fluid. These were present on her trunk and extremities. She also noted achiness in her groin/proximal thighs. No one at home had any rash or was sick. I had contacted infectious disease (ID) and obtained initial lab tests, including a swab of the draining lesion for monkeypox. Diane [name] was seen by Dr. Dennise (ID) on 6/12 and 6/17. Her Mpox swab was negative. However, Dr. Dennise did feel the presentation was consistent clinically with monkeypox.   Diane [name] notes her rash has improved, except for an ongoing lesion on the medial aspect of her left foot. This lesion continues to drain fluid and is tender. She is unable to wear closed shoes. As her job necessitates wearing closed shoes, she is unable to work currently. Her joint aches have resolved. Her other skin lesions are healing and she has no new lesions.  Diane [name] has a history of iron -deficiency anemia due to menorrhagia secondary to adenomyosis. She did go to  hematology and had iron  infusions to try and address her iron -deficiency. She was seen earlier this year by [name], CNM related to this. [name] has recommended consideration of hysterectomy. However, Diane [name] would like to have more children. She notes she is back to having a period again just 2 weeks after her last. She has talked with family and her mother feels she should consider moving ahead with surgery.   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 14 tablet 0   cetirizine  (ZYRTEC ) 10 MG tablet Take 1 tablet (10 mg total) by mouth daily. 30 tablet 11   diphenhydramine -calamine (CALAMINE PLUS) 1-8 % LOTN Apply to affected area PRN 1 mL 0   fluticasone  (FLONASE ) 50 MCG/ACT nasal spray Place 2 sprays  into both nostrils daily. 11.1 mL 5   hydrOXYzine  (ATARAX ) 25 MG tablet Take 1 tablet (25 mg total) by mouth 3 (three) times daily as needed. 30 tablet 0   linezolid (ZYVOX) 600 MG tablet Take 1 tablet (600 mg total) by mouth 2 (two) times daily. 14 tablet 0   naproxen  (NAPROSYN ) 500 MG tablet Take 1 tablet (500 mg total) by mouth 2 (two) times daily with a meal. 14 tablet 0   ondansetron  (ZOFRAN -ODT) 4 MG disintegrating tablet Take 1 tablet (4 mg total)  by mouth every 8 (eight) hours as needed. 20 tablet 3   tranexamic acid  (LYSTEDA ) 650 MG TABS tablet Take 2 tablets three times daily for up to 2 to 3 days during heavy bleeding 30 tablet 0   No facility-administered medications prior to visit.   Allergies  Allergen Reactions   Coconut Flavoring Agent (Non-Screening) Rash   Fish Allergy Rash    Mackeral only   Pineapple Rash     Objective:   Today's Vitals   12/24/23 1528  BP: 124/68  Pulse: 90  Temp: 97.8 F (36.6 C)  TempSrc: Temporal  SpO2: 95%  Weight: 187 lb (84.8 kg)  Height: 5' 7 (1.702 m)   Body mass index is 29.29 kg/m.   General: Well developed, well nourished. No acute distress. Extremities: No swelling noted of the joints. Skin: Warm and dry. Vesicles are generally healed with mild hypopigmented scarring. There continues to be a   draining small ulceration on the medial aspect of the left foot. Neuro: CN II-XII intact. Normal sensation and DTR bilaterally. Psych: Alert and oriented. Normal mood and affect.  Health Maintenance Due  Topic Date Due   Hepatitis B Vaccines (3 of 3 - 19+ 3-dose series) 05/27/2010   HPV VACCINES (1 - 3-dose SCDM series) Never done     Lab Results:    Latest Ref Rng & Units 12/16/2023   11:10 AM 12/10/2023   10:31 AM 10/24/2023    4:05 PM  CBC  WBC 3.8 - 10.8 Thousand/uL 4.8  6.7  4.0   Hemoglobin 11.7 - 15.5 g/dL 89.2  89.8  89.1   Hematocrit 35.0 - 45.0 % 34.1  30.0  34.6   Platelets 140 - 400 Thousand/uL 332  383.0  325    Component Ref Range & Units (hover) 13 d ago  SPECIMEN TYPE NOT GIVEN  ANATOMIC LOCATION NOT GIVEN  Comment: .  Orthopoxvirus DNA, QL PCR NOT DETECTED  MONKEYPOX VIRUS DNA, QL PCR NOT DETECTED    Assessment & Plan:  1. Vesicular rash- presumptive monkeypox (Primary) Resolving pox-like infection consistent with monkeypox. As she still has a draining lesion, I recommend she keep this covered. Due to the pain and the presence of the draining wound,  I do not recommend she return to work, until this resolves. I will see her back in 2 weeks to reassess this.  2. Adenomyosis 3. Menometrorrhagia Recommend she follow-up with GYN to discuss options for management and potentially proceed with hysterectomy.  Return in about 2 weeks (around 01/07/2024) for Reassessment.   Garnette CHRISTELLA Simpler, MD

## 2023-12-24 NOTE — Patient Instructions (Signed)
 Keep wound on foot covered. Follow-up with gynecologist.

## 2023-12-30 ENCOUNTER — Ambulatory Visit (INDEPENDENT_AMBULATORY_CARE_PROVIDER_SITE_OTHER): Payer: Self-pay | Admitting: Internal Medicine

## 2023-12-30 ENCOUNTER — Other Ambulatory Visit: Payer: Self-pay

## 2023-12-30 ENCOUNTER — Encounter: Payer: Self-pay | Admitting: Internal Medicine

## 2023-12-30 VITALS — BP 104/70 | HR 87 | Ht 67.0 in | Wt 182.0 lb

## 2023-12-30 DIAGNOSIS — R21 Rash and other nonspecific skin eruption: Secondary | ICD-10-CM

## 2023-12-30 NOTE — Progress Notes (Unsigned)
 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   CETIRIZINE  (ZYRTEC ) 10 MG TABLET    Take 1 tablet (10 mg total) by mouth daily.   DIPHENHYDRAMINE -CALAMINE (CALAMINE PLUS) 1-8 % LOTN    Apply to affected area PRN   FLUTICASONE  (FLONASE ) 50 MCG/ACT NASAL SPRAY    Place 2 sprays into both nostrils daily.   HYDROXYZINE  (ATARAX ) 25 MG TABLET    Take 1 tablet (25 mg total) by mouth 3 (three) times daily as needed.   LINEZOLID  (ZYVOX ) 600 MG TABLET    Take 1 tablet (600 mg total) by mouth 2 (two) times daily.   NAPROXEN  (NAPROSYN ) 500 MG TABLET    Take 1 tablet (500 mg total) by mouth 2 (two) times daily with a meal.   ONDANSETRON  (ZOFRAN -ODT) 4 MG DISINTEGRATING TABLET    Take 1 tablet (4 mg total) by mouth every 8 (eight) hours as needed.   TRANEXAMIC ACID  (LYSTEDA ) 650 MG TABS TABLET    Take 2 tablets three times daily for up to 2 to 3 days during heavy bleeding  Modified Medications   No medications on file  Discontinued Medications   No medications on file    Subjective: 33 year old female with past medical history as below referred from family practice office Dr. Loree in regards to concern for monkeypox.  She had complaint of painful rash, headache, poor appetite and diffuse aches.  She had been there for the past month to visit family.  Patient became ill she was seen by doctor and diagnosed with malaria and typhoid  fever.  She had mitral injection on the left thigh but was unclear as to what medication.  The current symptoms have been occurring for the past 5 to 7 days notes some of her lesions have opened up and been draining.  She notes achiness in thigh.  At the office HIV was done which was negative.  The lesions were swabbed for Today: 1PT has was in [place] a [place]1 month, returned last weeks. Not sexaully acitve x 1 yar [place]  farm [place]. Had dog and cat No known sick contacts. \Pt staes she has to boil water to shower in [place]. About 2 weeks ago body was itching. [place] she started having  pimples popping out. Theya re really itchy. No fevers ofr chills.  A bit nausea PT ahas allergies, . [facility]: 26typhif malaria, rx no rx. Body aches,  ha, nausea Kids1 year- 7 years.   6/17: her lesions have healed over except for none on her foot. She states that is where Cx where obtained. She is having N/V and diarrhea. Her friends has malaria who is in the hospital. Pt would like to get tested.  Today: doing well. No new lesion. Old lesion s healed. He did not start her abx but foot feels better. She  has a spot on her right thigh that she scratched, scab removed. Diarrhea/vomiting resolved Review of Systems: Review of Systems  All other systems reviewed and are negative.   Past Medical History:  Diagnosis Date   Dysmenorrhea    Medical history non-contributory    Pregnancy induced hypertension     Social History   Tobacco Use   Smoking status: Never   Smokeless tobacco: Never  Vaping Use   Vaping status: Never Used  Substance Use Topics   Alcohol use: No    Alcohol/week: 0.0 standard drinks of alcohol   Drug use: No    Comment: Discussed birth control options    Family History  Problem Relation Age of Onset   Hypertension Mother     Allergies  Allergen Reactions   Coconut Flavoring Agent (Non-Screening) Rash   Fish Allergy Rash     Mackeral only   Pineapple Rash    Health Maintenance  Topic Date Due   Hepatitis B Vaccines (3 of 3 - 19+ 3-dose series) 05/27/2010   HPV VACCINES (1 - 3-dose SCDM series) Never done   COVID-19 Vaccine (1 - 2024-25 season) Never done   INFLUENZA VACCINE  01/30/2024   Cervical Cancer Screening (HPV/Pap Cotest)  08/14/2028   DTaP/Tdap/Td (6 - Td or Tdap) 07/17/2033   Hepatitis C Screening  Completed   HIV Screening  Completed   Meningococcal B Vaccine  Aged Out    Objective:  Vitals:   12/30/23 1412  BP: 104/70  Pulse: 87  Weight: 182 lb (82.6 kg)  Height: 5' 7 (1.702 m)   Body mass index is 28.51 kg/m.  Physical Exam Constitutional:      Appearance: Normal appearance.  HENT:     Head: Normocephalic and atraumatic.     Right Ear: Tympanic membrane normal.     Left Ear: Tympanic membrane normal.     Nose: Nose normal.     Mouth/Throat:     Mouth: Mucous membranes are moist.  Eyes:     Extraocular Movements: Extraocular movements intact.     Conjunctiva/sclera: Conjunctivae normal.     Pupils: Pupils are equal, round, and reactive to light.  Cardiovascular:     Rate and Rhythm: Normal rate and regular rhythm.     Heart sounds: No murmur heard.    No friction rub. No gallop.  Pulmonary:     Effort: Pulmonary effort is normal.     Breath sounds: Normal breath sounds.  Abdominal:     General: Abdomen is flat.     Palpations: Abdomen is soft.  Musculoskeletal:        General: Normal range of motion.  Skin:    General: Skin is warm and dry.  Neurological:     General: No focal deficit present.     Mental Status: She is alert and oriented to person, place, and time.  Psychiatric:        Mood and Affect: Mood normal.    Lab Results Lab Results  Component Value Date   WBC 4.8 12/16/2023   HGB 10.7 (L) 12/16/2023   HCT 34.1 (L) 12/16/2023   MCV 89.7 12/16/2023   PLT 332 12/16/2023    Lab Results  Component Value Date   CREATININE 0.64 12/10/2023   BUN  8 12/10/2023   NA 138 12/10/2023   K 3.9 12/10/2023   CL 105 12/10/2023   CO2 28 12/10/2023    Lab Results  Component Value Date   ALT 10 12/10/2023  AST 11 12/10/2023   ALKPHOS 63 12/10/2023   BILITOT 0.9 12/10/2023    No results found for: CHOL, HDL, LDLCALC, LDLDIRECT, TRIG, CHOLHDL Lab Results  Component Value Date   LABRPR Non Reactive 04/13/2016   No results found for: HIV1RNAQUANT, HIV1RNAVL, CD4TABS   Problem List Items Addressed This Visit   None  Results   Assessment/Plan #Lesions c/f monkey pox -healed #Superimposed cellulitis of foot-resolved #N/V/Diarrhea-resolved -Pt did  not pick up abx, symptoms resolved. Lesions healed.  -Reviewed negative testing: malaria smear/gi studies(c diff/gip/O&P), hsv pct - I counseled if lesions return needs biopsy and call clinic.  -F/U with ID PRN     Loney Stank, MD [facility] 12/30/2023, 2:15 PM  I have personally spent 35 minutes involved in face-to-face and non-face-to-face activities for this patient on the day of the visit. Professional time spent includes the following activities: Preparing to see the patient (review of tests), Obtaining and/or reviewing separately obtained history (admission/discharge record), Performing a medically appropriate examination and/or evaluation , Ordering medications/tests/procedures, referring and communicating with other health care professionals, Documenting clinical information in the EMR, Independently interpreting results (not separately reported), Communicating results to the patient/family/caregiver, Counseling and educating the patient/family/caregiver and Care coordination (not separately reported).

## 2024-01-07 ENCOUNTER — Encounter: Payer: Self-pay | Admitting: Family Medicine

## 2024-01-07 ENCOUNTER — Ambulatory Visit (INDEPENDENT_AMBULATORY_CARE_PROVIDER_SITE_OTHER): Admitting: Family Medicine

## 2024-01-07 VITALS — BP 118/70 | HR 76 | Temp 97.6°F | Ht 67.0 in | Wt 186.0 lb

## 2024-01-07 DIAGNOSIS — R238 Other skin changes: Secondary | ICD-10-CM | POA: Diagnosis not present

## 2024-01-07 DIAGNOSIS — L03115 Cellulitis of right lower limb: Secondary | ICD-10-CM | POA: Insufficient documentation

## 2024-01-07 NOTE — Assessment & Plan Note (Signed)
 Improving. I recommend she pad over this with gauze inside of her sock. I will release her to return to work, but ask that her manager provide her with two additional 10-min breaks in an 8-hr shift.

## 2024-01-07 NOTE — Progress Notes (Signed)
 [facility] [address] [name] [address] Dept: [phone] Dept Fax: [phone]  Office Visit  Subjective:    Patient ID: Diane [name], female    DOB: 17-Jan-1991, 33 y.o..   MRN: [id number]  Chief Complaint  Patient presents with   Follow-up    2 week f/u.     History of Present Illness:  Patient is in today for follow-up of her right foot cellulitis and monitoring of resolution of her recent mpox outbreak.   I saw Diane [name] on 12/10/2023. She had complained of a painful rash, headache, poor appetite, and diffuse joint aches. Diane [name] returned a few days prior from a month long trip to the [place]. She noted that while there, she became ill. She was seen by a doctor and was diagnosed with malaria and typhoid fever. She received multiple injections into her left thigh. The symptoms present at that time had been occurring over the prior 5-7 days. She noted that some of the skin lesions have opened up and were draining fluid. These were present on her trunk and extremities. She also noted achiness in her groin/proximal thighs. No one at home had any rash or was sick. I had contacted infectious disease (ID) and obtained initial lab tests, including a swab of the draining lesion for monkeypox. Diane [name] was seen by Dr. Dennise (ID) on 6/12 and 6/17. Her Mpox swab was negative. However, Dr. Dennise did feel the presentation was consistent clinically with monkeypox.    At her last visit on 6/25, Diane [name] noted her rash had improved, except for an ongoing lesion on the medial aspect of her left foot. This lesion continued to drain fluid and was tender. She is unable to wear closed shoes. As her job necessitates wearing closed shoes, she was unable to work. Her joint aches had resolved. Her other skin lesions were healing and she has no new lesions.  Today, Diane [name] notes that the lesion on the foot is  healing. This worsened when she tried to cover it with a Band-aid. She is able to get a shoe on her foot now, though she notes it does hurt more with prolonged walking.   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 14 tablet 0   cetirizine  (ZYRTEC ) 10 MG tablet Take 1 tablet (10 mg total) by mouth daily. 30 tablet 11   diphenhydramine -calamine (CALAMINE PLUS) 1-8 % LOTN Apply to affected area PRN 1 mL 0   fluticasone  (FLONASE ) 50 MCG/ACT nasal spray Place 2 sprays into both nostrils daily. 11.1 mL 5   hydrOXYzine  (ATARAX ) 25 MG tablet Take 1 tablet (  25 mg total) by mouth 3 (three) times daily as needed. 30 tablet 0   linezolid  (ZYVOX ) 600 MG tablet Take 1 tablet (600 mg total) by mouth 2 (two) times daily. 14 tablet 0   naproxen  (NAPROSYN ) 500 MG tablet Take 1 tablet (500 mg total) by mouth 2 (two) times daily with a meal. 14 tablet 0   ondansetron  (ZOFRAN -ODT) 4 MG disintegrating tablet Take 1 tablet (4 mg total) by mouth every 8 (eight) hours as needed. 20 tablet 3   tranexamic acid  (LYSTEDA ) 650 MG TABS tablet Take 2 tablets three times daily for up to 2 to 3 days  during heavy bleeding 30 tablet 0   No facility-administered medications prior to visit.   Allergies  Allergen Reactions   Coconut Flavoring Agent (Non-Screening) Rash   Fish Allergy Rash    Mackeral only   Pineapple Rash     Objective:   Today's Vitals   01/07/24 1322  BP: 118/70  Pulse: 76  Temp: 97.6 F (36.4 C)  TempSrc: Temporal  SpO2: 100%  Weight: 186 lb (84.4 kg)  Height: 5' 7 (1.702 m)   Body mass index is 29.13 kg/m.   General: Well developed, well nourished. No acute distress. Foot: The previous lesion is crusted over without drainage at present. It appears she had some reaction tot he band-aid, as   there is an area of scaliness in a pattern of the pad of the Band-aid. Pain is much improved. Psych: Alert and oriented. Normal mood and affect.  Health Maintenance Due  Topic Date Due   Hepatitis B Vaccines (3 of 3 - 19+ 3-dose series) 05/27/2010   HPV VACCINES (1 - 3-dose SCDM series) Never done     Assessment & Plan:   Problem List Items Addressed This Visit       Musculoskeletal and Integument   Vesicular rash- presumptive monkeypox - Primary   Mostly resolved except for lesion on foot that became secondarily infected.        Other   Cellulitis of right foot   Improving. I recommend she pad over this with gauze inside of her sock. I will release her to return to work, but ask that her manager provide her with two additional 10-min breaks in an 8-hr shift.       Return in about 3 months (around 04/08/2024) for Reassessment.   Garnette CHRISTELLA Simpler, MD

## 2024-01-07 NOTE — Assessment & Plan Note (Signed)
 Mostly resolved except for lesion on foot that became secondarily infected.

## 2024-01-16 ENCOUNTER — Encounter: Payer: Self-pay | Admitting: Hematology and Oncology

## 2024-02-13 ENCOUNTER — Encounter: Payer: Self-pay | Admitting: Hematology and Oncology

## 2024-02-19 ENCOUNTER — Other Ambulatory Visit: Payer: Self-pay

## 2024-02-19 DIAGNOSIS — D500 Iron deficiency anemia secondary to blood loss (chronic): Secondary | ICD-10-CM

## 2024-02-20 ENCOUNTER — Inpatient Hospital Stay (HOSPITAL_BASED_OUTPATIENT_CLINIC_OR_DEPARTMENT_OTHER): Payer: Medicaid Other | Admitting: Hematology and Oncology

## 2024-02-20 ENCOUNTER — Inpatient Hospital Stay: Payer: Medicaid Other | Attending: Hematology and Oncology

## 2024-02-20 VITALS — BP 127/67 | HR 76 | Temp 98.2°F | Resp 13 | Wt 185.5 lb

## 2024-02-20 DIAGNOSIS — D500 Iron deficiency anemia secondary to blood loss (chronic): Secondary | ICD-10-CM

## 2024-02-20 DIAGNOSIS — D509 Iron deficiency anemia, unspecified: Secondary | ICD-10-CM | POA: Diagnosis present

## 2024-02-20 LAB — CBC WITH DIFFERENTIAL (CANCER CENTER ONLY)
Abs Immature Granulocytes: 0 K/uL (ref 0.00–0.07)
Basophils Absolute: 0 K/uL (ref 0.0–0.1)
Basophils Relative: 1 %
Eosinophils Absolute: 0.1 K/uL (ref 0.0–0.5)
Eosinophils Relative: 3 %
HCT: 30.6 % — ABNORMAL LOW (ref 36.0–46.0)
Hemoglobin: 10.1 g/dL — ABNORMAL LOW (ref 12.0–15.0)
Immature Granulocytes: 0 %
Lymphocytes Relative: 46 %
Lymphs Abs: 1.6 K/uL (ref 0.7–4.0)
MCH: 27.6 pg (ref 26.0–34.0)
MCHC: 33 g/dL (ref 30.0–36.0)
MCV: 83.6 fL (ref 80.0–100.0)
Monocytes Absolute: 0.2 K/uL (ref 0.1–1.0)
Monocytes Relative: 6 %
Neutro Abs: 1.5 K/uL — ABNORMAL LOW (ref 1.7–7.7)
Neutrophils Relative %: 44 %
Platelet Count: 317 K/uL (ref 150–400)
RBC: 3.66 MIL/uL — ABNORMAL LOW (ref 3.87–5.11)
RDW: 12.9 % (ref 11.5–15.5)
WBC Count: 3.5 K/uL — ABNORMAL LOW (ref 4.0–10.5)
nRBC: 0 % (ref 0.0–0.2)

## 2024-02-20 LAB — IRON AND IRON BINDING CAPACITY (CC-WL,HP ONLY)
Iron: 16 ug/dL — ABNORMAL LOW (ref 28–170)
Saturation Ratios: 4 % — ABNORMAL LOW (ref 10.4–31.8)
TIBC: 396 ug/dL (ref 250–450)
UIBC: 380 ug/dL (ref 148–442)

## 2024-02-20 LAB — FERRITIN: Ferritin: 14 ng/mL (ref 11–307)

## 2024-02-20 NOTE — Progress Notes (Signed)
 [facility] CONSULT NOTE  Patient Care Team: Thedora Garnette HERO, MD as PCP - General (Family Medicine) Tad, Arland POUR, CNM as Midwife (Certified Nurse Midwife) Loretha Ash, MD as Consulting Physician (Hematology and Oncology)  CHIEF COMPLAINTS/PURPOSE OF CONSULTATION:  [name]  ASSESSMENT & PLAN:   Assessment and Plan Assessment & Plan Iron  deficiency anemia Hemoglobin improved to 10.1 g/dL from 7.8 g/dL. Awaiting iron  lab results for further management. - Await iron  lab results and communicate findings via MyChart. - Consider iron  supplementation if iron  levels are suboptimal. - Discuss iron  pill options and dosing, emphasizing efficiency over patches or gummies and alternate day dosing for tolerance.  Menstrual irregularity with heavy menses Menstrual irregularity with heavy flow during initial days and delayed cycle onset. No fatigue or excessive sleepiness noted.   HISTORY OF PRESENTING ILLNESS:  Diane [name] 33 y.o. female is here because of [name]  Discussed the use of AI scribe software for clinical note transcription with the patient, who gave verbal consent to proceed.  History of Present Illness    Discussed the use of AI scribe software for clinical note transcription with the patient, who gave verbal consent to proceed.  History of Present Illness Diane [name] is a 33 year old female with iron  deficiency anemia who presents for follow-up of her anemia and menstrual irregularities.  She has experienced irregular menstruation, with her last period being longer than usual. Previously, her periods were regular but short. The first two to three days of her period are heavy, and she notes that the heaviness seems to be influenced by her diet, particularly the intake of sweet foods.  No symptoms of iron  deficiency such as fatigue, ice craving, or clay eating, which she experienced in the past. She no longer eats clay. She feels 'pretty good' and does not  experience excessive fatigue or sleepiness as she did previously, although she still sleeps early and feels a little tired.  Her hemoglobin level was 7.8 g/dL six months ago and is 89.8 g/dL today. She is awaiting results from her iron  labs, which are expected tomorrow.  No episodes of syncope, edema, or increased dyspnea on exertion. She engages in dancing as a form of exercise and is able to do so without difficulty.  She has two children, the youngest being 48 years old and starting kindergarten soon. She wants to have more children, mentioning a history of endometriosis but no fibroids.  All other systems were reviewed with the patient and are negative.  MEDICAL HISTORY:  Past Medical History:  Diagnosis Date   Dysmenorrhea    Medical history non-contributory    Pregnancy induced hypertension     SURGICAL HISTORY: Past Surgical History:  Procedure Laterality Date   UMBILICAL HERNIA REPAIR  07/01/2009   umbilical     SOCIAL HISTORY: Social History   Socioeconomic History   Marital status: Married    Spouse name: Not on file   Number of children: 2   Years of education: 15   Highest education level: Some college, no degree  Occupational History   Occupation: Scientist, product/process development  Tobacco Use   Smoking status: Never   Smokeless tobacco: Never  Vaping Use   Vaping status: Never Used  Substance and Sexual Activity   Alcohol use: No    Alcohol/week: 0.0 standard drinks of alcohol   Drug use: No    Comment: Discussed birth control options   Sexual activity: Yes    Partners: Male    Birth control/protection: None  Other Topics Concern   Not on file  Social History Narrative   Not on file   Social Drivers of Health   Financial Resource Strain: Not on file  Food Insecurity: Not on file  Transportation Needs: Not on file  Physical Activity: Not on file  Stress: Not on file  Social Connections: Not on file  Intimate Partner Violence: Not on file    FAMILY HISTORY: Family  History  Problem Relation Age of Onset   Hypertension Mother     ALLERGIES:  is allergic to coconut flavoring agent (non-screening), fish allergy, and pineapple.  MEDICATIONS:  Current Outpatient Medications  Medication Sig Dispense Refill   amoxicillin -clavulanate (AUGMENTIN ) 875-125 MG tablet Take 1 tablet by mouth 2 (two) times daily. 14 tablet 0   cetirizine  (ZYRTEC ) 10 MG tablet Take 1 tablet (10 mg total) by mouth daily. 30 tablet 11   diphenhydramine -calamine (CALAMINE PLUS) 1-8 % LOTN Apply to affected area PRN 1 mL 0   fluticasone  (FLONASE ) 50 MCG/ACT nasal spray Place 2 sprays into both nostrils daily. 11.1 mL 5   hydrOXYzine  (ATARAX ) 25 MG tablet Take 1 tablet (25 mg total) by mouth 3 (three) times daily as needed. 30 tablet 0   linezolid  (ZYVOX ) 600 MG tablet Take 1 tablet (600 mg total) by mouth 2 (two) times daily. 14 tablet 0   naproxen  (NAPROSYN ) 500 MG tablet Take 1 tablet (500 mg total) by mouth 2 (two) times daily with a meal. 14 tablet 0   ondansetron  (ZOFRAN -ODT) 4 MG disintegrating tablet Take 1 tablet (4 mg total) by mouth every 8 (eight) hours as needed. 20 tablet 3   tranexamic acid  (LYSTEDA ) 650 MG TABS tablet Take 2 tablets three times daily for up to 2 to 3 days during heavy bleeding 30 tablet 0   No current facility-administered medications for this visit.     PHYSICAL EXAMINATION: ECOG PERFORMANCE STATUS: 0 - Asymptomatic  Vitals:   02/20/24 0922  BP: 127/67  Pulse: 76  Resp: 13  Temp: 98.2 F (36.8 C)  SpO2: 100%   Filed Weights   02/20/24 0922  Weight: 185 lb 8 oz (84.1 kg)    GENERAL:alert, no distress and comfortable SKIN: skin color, texture, turgor are normal, no rashes or significant lesions EYES: normal, conjunctiva are pink and non-injected, sclera clear OROPHARYNX:no exudate, no erythema and lips, buccal mucosa, and tongue normal  NECK: supple, thyroid normal size, non-tender, without nodularity LYMPH:  no palpable lymphadenopathy  in the cervical, axillary  LUNGS: clear to auscultation and percussion with normal breathing effort HEART: regular rate & rhythm and no murmurs and no lower extremity edema ABDOMEN:abdomen soft, non-tender and normal bowel sounds Musculoskeletal:no cyanosis of digits and no clubbing  PSYCH: alert & oriented x 3 with fluent speech NEURO: no focal motor/sensory deficits  LABORATORY DATA:  I have reviewed the data as listed Lab Results  Component Value Date   WBC 3.5 (L) 02/20/2024   HGB 10.1 (L) 02/20/2024   HCT 30.6 (L) 02/20/2024   MCV 83.6 02/20/2024   PLT 317 02/20/2024     Chemistry      Component Value Date/Time   NA 138 12/10/2023 1031   K 3.9 12/10/2023 1031   CL 105 12/10/2023 1031   CO2 28 12/10/2023 1031   BUN 8 12/10/2023 1031   CREATININE 0.64 12/10/2023 1031   CREATININE 0.64 07/18/2023 1633      Component Value Date/Time   CALCIUM  8.9 12/10/2023 1031   ALKPHOS 63  12/10/2023 1031   AST 11 12/10/2023 1031   ALT 10 12/10/2023 1031   BILITOT 0.9 12/10/2023 1031       RADIOGRAPHIC STUDIES: I have personally reviewed the radiological images as listed and agreed with the findings in the report. No results found.  All questions were answered. The patient knows to call the clinic with any problems, questions or concerns. I spent 30 minutes in the care of this patient including H and P, review of records, counseling and coordination of care.     Amber Stalls, MD 02/20/2024 11:31 AM

## 2024-02-23 ENCOUNTER — Ambulatory Visit: Payer: Self-pay | Admitting: Hematology and Oncology

## 2024-03-26 ENCOUNTER — Other Ambulatory Visit: Payer: Self-pay

## 2024-03-26 ENCOUNTER — Encounter (HOSPITAL_COMMUNITY): Payer: Self-pay | Admitting: *Deleted

## 2024-03-26 ENCOUNTER — Emergency Department (HOSPITAL_COMMUNITY)
Admission: EM | Admit: 2024-03-26 | Discharge: 2024-03-27 | Disposition: A | Discharge status: Home / Self Care | Attending: Emergency Medicine | Admitting: Emergency Medicine

## 2024-03-26 DIAGNOSIS — D219 Benign neoplasm of connective and other soft tissue, unspecified: Secondary | ICD-10-CM | POA: Diagnosis not present

## 2024-03-26 DIAGNOSIS — D649 Anemia, unspecified: Secondary | ICD-10-CM | POA: Diagnosis not present

## 2024-03-26 DIAGNOSIS — R1032 Left lower quadrant pain: Secondary | ICD-10-CM | POA: Insufficient documentation

## 2024-03-26 LAB — COMPREHENSIVE METABOLIC PANEL WITH GFR
ALT: 12 U/L (ref 0–44)
AST: 16 U/L (ref 15–41)
Albumin: 3.5 g/dL (ref 3.5–5.0)
Alkaline Phosphatase: 63 U/L (ref 38–126)
Anion gap: 8 (ref 5–15)
BUN: 9 mg/dL (ref 6–20)
CO2: 23 mmol/L (ref 22–32)
Calcium: 8.8 mg/dL — ABNORMAL LOW (ref 8.9–10.3)
Chloride: 109 mmol/L (ref 98–111)
Creatinine, Ser: 0.79 mg/dL (ref 0.44–1.00)
GFR, Estimated: >60 mL/min (ref >60)
Glucose, Bld: 92 mg/dL (ref 70–99)
Potassium: 3.8 mmol/L (ref 3.5–5.1)
Sodium: 140 mmol/L (ref 135–145)
Total Bilirubin: 1.9 mg/dL — ABNORMAL HIGH (ref 0.0–1.2)
Total Protein: 6.6 g/dL (ref 6.5–8.1)

## 2024-03-26 LAB — CBC WITH DIFFERENTIAL/PLATELET
Abs Immature Granulocytes: 0.01 K/uL (ref 0.00–0.07)
Basophils Absolute: 0 K/uL (ref 0.0–0.1)
Basophils Relative: 1 %
Eosinophils Absolute: 0.1 K/uL (ref 0.0–0.5)
Eosinophils Relative: 3 %
HCT: 31.2 % — ABNORMAL LOW (ref 36.0–46.0)
Hemoglobin: 9.8 g/dL — ABNORMAL LOW (ref 12.0–15.0)
Immature Granulocytes: 0 %
Lymphocytes Relative: 34 %
Lymphs Abs: 1.1 K/uL (ref 0.7–4.0)
MCH: 26.6 pg (ref 26.0–34.0)
MCHC: 31.4 g/dL (ref 30.0–36.0)
MCV: 84.8 fL (ref 80.0–100.0)
Monocytes Absolute: 0.4 K/uL (ref 0.1–1.0)
Monocytes Relative: 12 %
Neutro Abs: 1.6 K/uL — ABNORMAL LOW (ref 1.7–7.7)
Neutrophils Relative %: 50 %
Platelets: 217 K/uL (ref 150–400)
RBC: 3.68 MIL/uL — ABNORMAL LOW (ref 3.87–5.11)
RDW: 14.2 % (ref 11.5–15.5)
WBC: 3.2 K/uL — ABNORMAL LOW (ref 4.0–10.5)
nRBC: 0 % (ref 0.0–0.2)

## 2024-03-26 LAB — URINALYSIS, ROUTINE W REFLEX MICROSCOPIC
Bacteria, UA: NONE SEEN
Bilirubin Urine: NEGATIVE
Glucose, UA: NEGATIVE mg/dL
Ketones, ur: NEGATIVE mg/dL
Leukocytes,Ua: NEGATIVE
Nitrite: NEGATIVE
Protein, ur: NEGATIVE mg/dL
Specific Gravity, Urine: 1.021 (ref 1.005–1.030)
pH: 5 (ref 5.0–8.0)

## 2024-03-26 LAB — PREGNANCY, URINE: Preg Test, Ur: NEGATIVE

## 2024-03-26 LAB — LIPASE, BLOOD: Lipase: 19 U/L (ref 11–51)

## 2024-03-26 NOTE — ED Provider Triage Note (Signed)
 Emergency Medicine Provider Triage Evaluation Note  Diane [name] , a 33 y.o. female  was evaluated in triage.  Pt complains of generalized abdominal pain, diarrhea and headache. Patient reports she has been having diarrhea and bowel urgency for the last 2 weeks, around the time of her grandfather's funeral. She describes generalized, colicky abdominal pain. She says the pain sometimes goes to her back. Patient also reports some blood in her bowel movements 3 days ago, when she wiped. She also reports nausea, some days of constipation, but no vomiting. In addition, she has a right sided headache that has been ongoing for the last several days that she says is pulsating behind her right eye.    Review of Systems  Positive: Nausea, abdominal pain Negative: Vomiting, chest pain  Physical Exam  BP 119/75   Pulse 76   Temp 98.5 F (36.9 C)   Resp 18   Ht 5' 7 (1.702 m)   Wt 84.1 kg   LMP 03/05/2024   SpO2 100%   BMI 29.04 kg/m  Gen:   Awake, no distress  Resp:  Normal effort MSK:   Moves extremities without difficulty  Other:  Generalized abdominal tenderness, no peritoneal signs.   Medical Decision Making  Medically screening exam initiated at 4:13 PM.  Appropriate orders placed.  Desire Holster was informed that the remainder of the evaluation will be completed by another provider, this initial triage assessment does not replace that evaluation, and the importance of remaining in the ED until their evaluation is complete.    Torrence Marry RAMAN, [place] 03/26/24 [phone]

## 2024-03-26 NOTE — ED Notes (Signed)
 PT requested pain meds. Triage RN made aware.

## 2024-03-26 NOTE — ED Triage Notes (Signed)
 Abd pain and a headache with nausea  for 2 weeks  lmp  she is c/o a lt breast pain also lmp  sept 5

## 2024-03-27 ENCOUNTER — Emergency Department (HOSPITAL_COMMUNITY)

## 2024-03-27 DIAGNOSIS — R1032 Left lower quadrant pain: Secondary | ICD-10-CM | POA: Diagnosis not present

## 2024-03-27 MED ORDER — IOHEXOL 350 MG/ML SOLN
75.0000 mL | Freq: Once | INTRAVENOUS | Status: AC | PRN
Start: 2024-03-27 — End: 2024-03-27
  Administered 2024-03-27: 75 mL via INTRAVENOUS

## 2024-03-27 MED ORDER — NAPROXEN 500 MG PO TABS: 500.0000 mg | ORAL_TABLET | Freq: Two times a day (BID) | ORAL | 0 refills | Status: AC

## 2024-03-27 MED ORDER — OXYCODONE-ACETAMINOPHEN 5-325 MG PO TABS
ORAL_TABLET | ORAL | Status: AC
  Filled 2024-03-27: qty 1

## 2024-03-27 MED ORDER — NAPROXEN 250 MG PO TABS
500.0000 mg | ORAL_TABLET | Freq: Once | ORAL | Status: AC
  Administered 2024-03-27: 500 mg via ORAL
  Filled 2024-03-27: qty 2

## 2024-03-27 MED ORDER — OXYCODONE-ACETAMINOPHEN 5-325 MG PO TABS
1.0000 | ORAL_TABLET | Freq: Once | ORAL | Status: AC
  Administered 2024-03-27: 1 via ORAL

## 2024-03-27 MED ORDER — HYDROCODONE-ACETAMINOPHEN 5-325 MG PO TABS: 1.0000 | ORAL_TABLET | ORAL | 0 refills | Status: AC | PRN

## 2024-03-27 NOTE — Discharge Instructions (Addendum)
 You were evaluated in the Emergency Department and after careful evaluation, we did not find any emergent condition requiring admission or further testing in the hospital.  Your exam/testing today is overall reassuring.  Symptoms may be related to fibroids of the uterus.  Recommend follow-up with the OB/GYN doctors.  You may need a follow-up ultrasound.  Take the Naprosyn  twice daily for pain.  Use the Norco for more significant pain keeping you from sleep.  Increase the amount of iron  in your diet to help with your blood counts.  Please return to the Emergency Department if you experience any worsening of your condition.   Thank you for allowing us  to be a part of your care.

## 2024-03-27 NOTE — ED Provider Notes (Signed)
 [facility]-EMERGENCY DEPT [facility] Emergency Department Provider Note MRN:  [id number]  Arrival date & time: 03/27/24     Chief Complaint   Abdominal pain History of Present Illness   Diane [name] is a 33 y.o. year-old female with no pertinent past medical history presenting to the ED with chief complaint of abdominal pain.  Left lower quadrant abdominal pain for the past 2 weeks.  Comes and goes.  Had some diarrhea initially but that went away.  Has had some continued nausea on occasion.  No fever, no other complaints.  Review of Systems  A thorough review of systems was obtained and all systems are negative except as noted in the HPI and PMH.   Patient's Health History    Past Medical History:  Diagnosis Date   Dysmenorrhea    Medical history non-contributory    Pregnancy induced hypertension     Past Surgical History:  Procedure Laterality Date   UMBILICAL HERNIA REPAIR  07/01/2009   umbilical     Family History  Problem Relation Age of Onset   Hypertension Mother     Social History   Socioeconomic History   Marital status: Married    Spouse name: Not on file   Number of children: 2   Years of education: 15   Highest education level: Some college, no degree  Occupational History   Occupation: Scientist, product/process development  Tobacco Use   Smoking status: Never   Smokeless tobacco: Never  Vaping Use   Vaping status: Never Used  Substance and Sexual Activity   Alcohol use: No    Alcohol/week: 0.0 standard drinks of alcohol   Drug use: No    Comment: Discussed birth control options   Sexual activity: Yes    Partners: Male    Birth control/protection: None  Other Topics Concern   Not on file  Social History Narrative   Not on file   Social Drivers of Health   Financial Resource Strain: Not on file  Food Insecurity: Not on file  Transportation Needs: Not on file  Physical Activity: Not on file  Stress: Not on file  Social Connections: Not on file  Intimate Partner  Violence: Not on file     Physical Exam   Vitals:   03/26/24 2237 03/27/24 0243  BP: 117/67 122/83  Pulse: 76 70  Resp: 14 18  Temp: 98.1 F (36.7 C) 98.1 F (36.7 C)  SpO2: 95% 100%    CONSTITUTIONAL: Well-appearing, NAD NEURO/PSYCH:  Alert and oriented x 3, no focal deficits EYES:  eyes equal and reactive ENT/NECK:  no LAD, no JVD CARDIO: Regular rate, well-perfused, normal S1 and S2 PULM:  CTAB no wheezing or rhonchi GI/GU:  non-distended, non-tender MSK/SPINE:  No gross deformities, no edema SKIN:  no rash, atraumatic   *Additional and/or pertinent findings included in MDM below  Diagnostic and Interventional Summary    EKG Interpretation Date/Time:    Ventricular Rate:    PR Interval:    QRS Duration:    QT Interval:    QTC Calculation:   R Axis:      Text Interpretation:         Labs Reviewed  CBC WITH DIFFERENTIAL/PLATELET - Abnormal; Notable for the following components:      Result Value   WBC 3.2 (*)    RBC 3.68 (*)    Hemoglobin 9.8 (*)    HCT 31.2 (*)    Neutro Abs 1.6 (*)    All other components within normal limits  COMPREHENSIVE METABOLIC PANEL WITH GFR - Abnormal; Notable for the following components:   Calcium  8.8 (*)    Total Bilirubin 1.9 (*)    All other components within normal limits  URINALYSIS, ROUTINE W REFLEX MICROSCOPIC - Abnormal; Notable for the following components:   Hgb urine dipstick MODERATE (*)    All other components within normal limits  LIPASE, BLOOD  PREGNANCY, URINE    CT ABDOMEN PELVIS W CONTRAST  Final Result      Medications  naproxen  (NAPROSYN ) tablet 500 mg (has no administration in time range)  oxyCODONE -acetaminophen  (PERCOCET/ROXICET) 5-325 MG per tablet 1 tablet (1 tablet Oral Given 03/27/24 0220)  iohexol (OMNIPAQUE) 350 MG/ML injection 75 mL (75 mLs Intravenous Contrast Given 03/27/24 0243)     Procedures  /  Critical Care Procedures  ED Course and Medical Decision Making  Initial Impression  and Ddx Differential diagnosis includes diverticulitis, ovarian cyst, UTI, colitis.  Doubt torsion.  Past medical/surgical history that increases complexity of ED encounter: None  Interpretation of Diagnostics I personally reviewed the Laboratory Testing and my interpretation is as follows: Labs are fine, mild anemia  CT largely unremarkable, evidence of uterine fibroids, mild splenomegaly  Patient Reassessment and Ultimate Disposition/Management     No evidence of emergent process, patient appropriate for discharge.  Patient management required discussion with the following services or consulting groups:  None  Complexity of Problems Addressed Acute illness or injury that poses threat of life of bodily function  Additional Data Reviewed and Analyzed Further history obtained from: Further history from spouse/family member  Additional Factors Impacting ED Encounter Risk Prescriptions  Ozell HERO. Theadore, MD [facility] Emergency Medicine [facility] [email or web address]  Final Clinical Impressions(s) / ED Diagnoses     ICD-10-CM   1. Left lower quadrant abdominal pain  R10.32     2. Fibroids  D21.9       ED Discharge Orders          Ordered    naproxen  (NAPROSYN ) 500 MG tablet  2 times daily        03/27/24 0517    HYDROcodone-acetaminophen  (NORCO/VICODIN) 5-325 MG tablet  Every 4 hours PRN        03/27/24 0517             Discharge Instructions Discussed with and Provided to Patient:    Discharge Instructions      You were evaluated in the Emergency Department and after careful evaluation, we did not find any emergent condition requiring admission or further testing in the hospital.  Your exam/testing today is overall reassuring.  Symptoms may be related to fibroids of the uterus.  Recommend follow-up with the OB/GYN doctors.  You may need a follow-up ultrasound.  Take the Naprosyn  twice daily for pain.  Use the Norco for more significant pain  keeping you from sleep.  Increase the amount of iron  in your diet to help with your blood counts.  Please return to the Emergency Department if you experience any worsening of your condition.   Thank you for allowing us  to be a part of your care.      Theadore Ozell HERO, MD 03/27/24 [phone]

## 2024-03-27 NOTE — ED Triage Notes (Signed)
 To ct

## 2024-03-29 ENCOUNTER — Ambulatory Visit (INDEPENDENT_AMBULATORY_CARE_PROVIDER_SITE_OTHER): Admitting: Emergency Medicine

## 2024-03-29 ENCOUNTER — Encounter: Payer: Self-pay | Admitting: Emergency Medicine

## 2024-03-29 ENCOUNTER — Ambulatory Visit: Payer: Self-pay

## 2024-03-29 VITALS — BP 118/74 | HR 78 | Temp 97.4°F | Ht 67.0 in | Wt 190.0 lb

## 2024-03-29 DIAGNOSIS — K5900 Constipation, unspecified: Secondary | ICD-10-CM

## 2024-03-29 DIAGNOSIS — R1032 Left lower quadrant pain: Secondary | ICD-10-CM

## 2024-03-29 DIAGNOSIS — K219 Gastro-esophageal reflux disease without esophagitis: Secondary | ICD-10-CM | POA: Diagnosis not present

## 2024-03-29 DIAGNOSIS — R1010 Upper abdominal pain, unspecified: Secondary | ICD-10-CM | POA: Diagnosis not present

## 2024-03-29 DIAGNOSIS — R1111 Vomiting without nausea: Secondary | ICD-10-CM | POA: Diagnosis not present

## 2024-03-29 DIAGNOSIS — R1031 Right lower quadrant pain: Secondary | ICD-10-CM | POA: Diagnosis not present

## 2024-03-29 DIAGNOSIS — D259 Leiomyoma of uterus, unspecified: Secondary | ICD-10-CM

## 2024-03-29 DIAGNOSIS — R161 Splenomegaly, not elsewhere classified: Secondary | ICD-10-CM

## 2024-03-29 MED ORDER — ONDANSETRON 4 MG PO TBDP: 4.0000 mg | ORAL_TABLET | Freq: Three times a day (TID) | ORAL | 0 refills | Status: AC | PRN

## 2024-03-29 MED ORDER — OMEPRAZOLE 20 MG PO CPDR: 20.0000 mg | DELAYED_RELEASE_CAPSULE | Freq: Every day | ORAL | 0 refills | Status: AC

## 2024-03-29 NOTE — Telephone Encounter (Signed)
 FYI Only or Action Required?: FYI only for provider.  Patient was last seen in primary care on 01/07/2024 by Thedora Garnette HERO, MD.  Called Nurse Triage reporting Abdominal Pain.  Symptoms began several days ago.  Interventions attempted: Prescription medications: Naproxen , hydrocodone.  Symptoms are: gradually worsening.  Triage Disposition: See HCP Within 4 Hours (Or PCP Triage)  Patient/caregiver understands and will follow disposition?: Yes  **appt. Scheduled for 9/29**       Copied from CRM #[id number]. Topic: Clinical - Red Word Triage >> Mar 29, 2024 10:38 AM Hamdi H wrote: Red Word that prompted transfer to Nurse Triage: ER on Friday still has back pain, stomach pain, and still vomiting. Reason for Disposition  [1] MILD-MODERATE pain AND [2] constant AND [3] present > 2 hours  Answer Assessment - Initial Assessment Questions 1. LOCATION: Where does it hurt?      Mid abdomen, and left side. Back pain is in the middle, and lower  2. RADIATION: Does the pain shoot anywhere else? (e.g., chest, back)     Back   3. ONSET: When did the pain begin? (e.g., minutes, hours or days ago)      Last Friday  4. SUDDEN: Gradual or sudden onset?     Gradual   5. PATTERN Does the pain come and go, or is it constant?     Intermittent   6. SEVERITY: How bad is the pain?  (e.g., Scale 1-10; mild, moderate, or severe)     Back is moderate, abdomen is an 8/10  7. RECURRENT SYMPTOM: Have you ever had this type of stomach pain before? If Yes, ask: When was the last time? and What happened that time?      No   8. CAUSE: What do you think is causing the stomach pain? (e.g., gallstones, recent abdominal surgery)      Unknown but dx. With uterine fibroids   9. RELIEVING/AGGRAVATING FACTORS: What makes it better or worse? (e.g., antacids, bending or twisting motion, bowel movement)     Sleep makes the pain go away  10. OTHER SYMPTOMS: Do you have any other  symptoms? (e.g., back pain, diarrhea, fever, urination pain, vomiting)        Vomiting from the pain medication prescribed.   11. PREGNANCY: Is there any chance you are pregnant? When was your last menstrual period?       No  Was seen in the ED last Friday; was dx. With uterine fibroids, she was prescribed Naproxen , hydrocodone. She was advised to follow-up with PCP. Patient has another appt. Today at 2:00pm; she requested the latest time available in which she is scheduled today 9/29 at 4:00pm.  Protocols used: Abdominal Pain - [facility]

## 2024-03-29 NOTE — Progress Notes (Signed)
 Subjective:  Abdominal Pain (x3 weeks. Vomiting. Constipation- blood in stool occasionally )    HPI: Diane [name] is a 33 y.o. female presenting on 03/29/2024 with report of continued abdominal pain after emergency department visit for same 3 days ago.  Began 3 weeks ago. She did have some diarrhea for a few days, but that resolved and now has constipation with occ blood in stools with straining.  Pain is sharp, intermittent, worse after eating or moving around.  She did develop emesis the day she presented to ED, which happens any time she tries to eat or drink- not sure if related to pain medication or acute illness.  No fevers at any time.  Only prior abd surgery is umbilical hernia repair, per patient report.  ED workup included labs and CT, with imaging and lab reports as below. CBC findings are consistent with prior values per chart review.   CBC with Differential     Status: Abnormal   Collection Time: 03/26/24  4:13 PM  Result Value Ref Range   WBC 3.2 (L) 4.0 - 10.5 K/uL   RBC 3.68 (L) 3.87 - 5.11 MIL/uL   Hemoglobin 9.8 (L) 12.0 - 15.0 g/dL   HCT 68.7 (L) 63.9 - 53.9 %   MCV 84.8 80.0 - 100.0 fL   MCH 26.6 26.0 - 34.0 pg   MCHC 31.4 30.0 - 36.0 g/dL   RDW 85.7 88.4 - 84.4 %   Platelets 217 150 - 400 K/uL   nRBC 0.0 0.0 - 0.2 %   Neutrophils Relative % 50 %   Neutro Abs 1.6 (L) 1.7 - 7.7 K/uL   Lymphocytes Relative 34 %   Lymphs Abs 1.1 0.7 - 4.0 K/uL   Monocytes Relative 12 %   Monocytes Absolute 0.4 0.1 - 1.0 K/uL   Eosinophils Relative 3 %   Eosinophils Absolute 0.1 0.0 - 0.5 K/uL   Basophils Relative 1 %   Basophils Absolute 0.0 0.0 - 0.1 K/uL   Immature Granulocytes 0 %   Abs Immature Granulocytes 0.01 0.00 - 0.07 K/uL    Comment: Performed at [facility], [address], [place], [place] [address]  Comprehensive metabolic panel     Status: Abnormal   Collection Time: 03/26/24  4:13 PM  Result Value Ref Range   Sodium 140 135 - 145 mmol/L    Potassium 3.8 3.5 - 5.1 mmol/L   Chloride 109 98 - 111 mmol/L   CO2 23 22 - 32 mmol/L   Glucose, Bld 92 70 - 99 mg/dL    Comment: Glucose reference range applies only to samples taken after fasting for at least 8 hours.   BUN 9 6 - 20 mg/dL   Creatinine, Ser 9.20 0.44 - 1.00 mg/dL   Calcium  8.8 (L) 8.9 - 10.3 mg/dL   Total Protein 6.6 6.5 - 8.1 g/dL   Albumin 3.5 3.5 - 5.0 g/dL   AST 16 15 - 41 U/L   ALT 12 0 - 44 U/L   Alkaline Phosphatase 63 38 - 126 U/L   Total Bilirubin 1.9 (H) 0.0 - 1.2 mg/dL   GFR, Estimated >39 >39 mL/min    Comment: (NOTE) Calculated using the CKD-EPI Creatinine Equation (2021)    Anion gap 8 5 - 15    Comment: Performed at [facility], [address] [place], [place] [address]  Lipase, blood     Status: None   Collection Time: 03/26/24  4:13 PM  Result Value  Ref Range   Lipase 19 11 - 51 U/L    Comment: Performed at [facility], [address] [place], [place] [address]  Urinalysis, Routine w reflex microscopic -Urine, Clean Catch     Status: Abnormal   Collection Time: 03/26/24  8:16 PM  Result Value Ref Range   Color, Urine YELLOW YELLOW   APPearance CLEAR CLEAR   Specific Gravity, Urine 1.021 1.005 - 1.030   pH 5.0 5.0 - 8.0   Glucose, UA NEGATIVE NEGATIVE mg/dL   Hgb urine dipstick MODERATE (A) NEGATIVE   Bilirubin Urine NEGATIVE NEGATIVE   Ketones, ur NEGATIVE NEGATIVE mg/dL   Protein, ur NEGATIVE NEGATIVE mg/dL   Nitrite NEGATIVE NEGATIVE   Leukocytes,Ua NEGATIVE NEGATIVE   RBC / HPF 21-50 0 - 5 RBC/hpf   WBC, UA 0-5 0 - 5 WBC/hpf   Bacteria, UA NONE SEEN NONE SEEN   Squamous Epithelial / HPF 0-5 0 - 5 /HPF   Mucus PRESENT     Comment: Performed at [facility], [address] [place], [place] [address]  Pregnancy, urine     Status: None   Collection Time: 03/26/24  8:16 PM  Result Value Ref Range   Preg Test, Ur NEGATIVE NEGATIVE    Comment:        THE SENSITIVITY OF THIS METHODOLOGY IS >20  mIU/mL. Performed at [facility], [address], [place], [place] [address]      CT ABDOMEN PELVIS W CONTRAST Result Date: 03/27/2024 CLINICAL DATA:  Abdominal pain. EXAM: CT ABDOMEN AND PELVIS WITH CONTRAST TECHNIQUE: Multidetector CT imaging of the abdomen and pelvis was performed using the standard protocol following bolus administration of intravenous contrast. RADIATION DOSE REDUCTION: This exam was performed according to the departmental dose-optimization program which includes automated exposure control, adjustment of the mA and/or kV according to patient size and/or use of iterative reconstruction technique. CONTRAST:  75mL OMNIPAQUE IOHEXOL 350 MG/ML SOLN COMPARISON:  None Available. FINDINGS: Lower chest: No acute abnormality. Hepatobiliary: No focal liver abnormality is seen. The gallbladder is contracted without evidence of gallstones, gallbladder wall thickening, or biliary dilatation. Pancreas: Unremarkable. No pancreatic ductal dilatation or surrounding inflammatory changes. Spleen: The spleen is mildly enlarged and is otherwise unremarkable. Adrenals/Urinary Tract: Adrenal glands are unremarkable. Kidneys are normal, without renal calculi, focal lesion, or hydronephrosis. Bladder is unremarkable. Stomach/Bowel: The stomach contains a large amount of ingested material and is otherwise within normal limits. Appendix appears normal. No evidence of bowel wall thickening, distention, or inflammatory changes. Vascular/Lymphatic: No significant vascular findings are present. No enlarged abdominal or pelvic lymph nodes. Reproductive: The uterine fundus is mildly enlarged and heterogeneous in appearance. The bilateral adnexa are unremarkable. Other: No abdominal wall hernia or abnormality. No abdominopelvic ascites. Musculoskeletal: No acute or significant osseous findings. IMPRESSION: 1. Mild splenomegaly. 2. Mildly enlarged and heterogeneous uterine fundus which may represent the presence of  uterine fibroids. Correlation with nonemergent pelvic ultrasound is recommended. Electronically Signed   By: Suzen Dials M.D.   On: 03/27/2024 03:25       ROS: Negative unless specifically indicated above in HPI.   Relevant past medical history reviewed and updated as indicated.   Allergies and medications reviewed and updated.   Current Outpatient Medications:    cetirizine  (ZYRTEC ) 10 MG tablet, Take 1 tablet (10 mg total) by mouth daily., Disp: 30 tablet, Rfl: 11   HYDROcodone-acetaminophen  (NORCO/VICODIN) 5-325 MG tablet, Take 1 tablet by mouth every 4 (four) hours  as needed., Disp: 12 tablet, Rfl: 0   naproxen  (NAPROSYN ) 500 MG tablet, Take 1 tablet (500 mg total) by mouth 2 (two) times daily., Disp: 30 tablet, Rfl: 0   omeprazole (PRILOSEC) 20 MG capsule, Take 1 capsule (20 mg total) by mouth daily., Disp: 30 capsule, Rfl: 0   amoxicillin -clavulanate (AUGMENTIN ) 875-125 MG tablet, Take 1 tablet by mouth 2 (two) times daily., Disp: 14 tablet, Rfl: 0   diphenhydramine -calamine (CALAMINE PLUS) 1-8 % LOTN, Apply to affected area PRN (Patient not taking: Reported on 03/29/2024), Disp: 1 mL, Rfl: 0   fluticasone  (FLONASE ) 50 MCG/ACT nasal spray, Place 2 sprays into both nostrils daily. (Patient not taking: Reported on 03/29/2024), Disp: 11.1 mL, Rfl: 5   hydrOXYzine  (ATARAX ) 25 MG tablet, Take 1 tablet (25 mg total) by mouth 3 (three) times daily as needed. (Patient not taking: Reported on 03/29/2024), Disp: 30 tablet, Rfl: 0   linezolid  (ZYVOX ) 600 MG tablet, Take 1 tablet (600 mg total) by mouth 2 (two) times daily. (Patient not taking: Reported on 03/29/2024), Disp: 14 tablet, Rfl: 0   ondansetron  (ZOFRAN -ODT) 4 MG disintegrating tablet, Take 1 tablet (4 mg total) by mouth every 8 (eight) hours as needed for vomiting., Disp: 20 tablet, Rfl: 0   tranexamic acid  (LYSTEDA ) 650 MG TABS tablet, Take 2 tablets three times daily for up to 2 to 3 days during heavy bleeding (Patient not taking:  Reported on 03/29/2024), Disp: 30 tablet, Rfl: 0  Allergies  Allergen Reactions   Coconut Flavoring Agent (Non-Screening) Rash   Fish Allergy Rash    Mackeral only   Pineapple Rash    Social History   Tobacco Use   Smoking status: Never   Smokeless tobacco: Never  Vaping Use   Vaping status: Never Used  Substance Use Topics   Alcohol use: No    Alcohol/week: 0.0 standard drinks of alcohol   Drug use: No    Comment: Discussed birth control options     Objective:   Vitals:   03/29/24 1547  BP: 118/74  Pulse: 78  Temp: (!) 97.4 F (36.3 C)  Height: 5' 7 (1.702 m)  Weight: 190 lb (86.2 kg)  BMI (Calculated): 29.75     Appears well, INAD Sclera anicteric Oral mucosa moist Heart RRR Normal resp effort and excursion.  Abd soft, tenderness to all areas EXCEPT RUQ, epigastric region. Good bowel sounds.     Assessment & Plan:   Assessment & Plan Acute upper abdominal pain  Orders:   US  Abdomen Complete; Future   CBC w/Diff   Comp Met (CMET)  Acute bilateral lower abdominal pain     Vomiting without nausea, unspecified vomiting type  Orders:   ondansetron  (ZOFRAN -ODT) 4 MG disintegrating tablet; Take 1 tablet (4 mg total) by mouth every 8 (eight) hours as needed for vomiting.  Gastroesophageal reflux disease, unspecified whether esophagitis present  Orders:   omeprazole (PRILOSEC) 20 MG capsule; Take 1 capsule (20 mg total) by mouth daily.  Spleen enlargement- seen on CT  Orders:   US  Abdomen Complete; Future  Uterine leiomyoma, unspecified location- seen on CT, known history of same     Constipation, unspecified constipation type      We discussed all ED results, exam today non-peritoneal, doubt acute perforation or other surgical emergency based on current exam and VS.  We will trend labs, get abd US , and reviewed medication recommendations and close f/u precautions  Patient Instructions  You will get a call about the ultrasound, which  we hope to get tomorrow or the next day Bloodwork will come back in the next day, and we will let you know the results  I suggest the omeprazole for acid, once a day. I do want you to take ondansetron  about an hour before this, to be sure you can hold down the medication and get in fluids. You can stop taking the ondansetron  when your vomiting calms down For the constipation: once you can hold down fluids, I recommend miralax. 1 cap mixed into 6oz of liquid of your choice For food- stick only with small bites/sips of warm/hot foods or beverages, that are bland (not spicy, no acid) If you get worse while we wait on tests- go back to the ER for further evaluation Otherwise, go ahead and call the gynecology office to arrange a follow-up visit for your fibroids.     Follow up plan: No follow-ups on file.  Corean Geralds, MSPAS, PA-C

## 2024-03-29 NOTE — Patient Instructions (Signed)
 You will get a call about the ultrasound, which we hope to get tomorrow or the next day Bloodwork will come back in the next day, and we will let you know the results  I suggest the omeprazole for acid, once a day. I do want you to take ondansetron  about an hour before this, to be sure you can hold down the medication and get in fluids. You can stop taking the ondansetron  when your vomiting calms down For the constipation: once you can hold down fluids, I recommend miralax. 1 cap mixed into 6oz of liquid of your choice For food- stick only with small bites/sips of warm/hot foods or beverages, that are bland (not spicy, no acid) If you get worse while we wait on tests- go back to the ER for further evaluation Otherwise, go ahead and call the gynecology office to arrange a follow-up visit for your fibroids.

## 2024-03-30 ENCOUNTER — Telehealth (HOSPITAL_BASED_OUTPATIENT_CLINIC_OR_DEPARTMENT_OTHER): Payer: Self-pay

## 2024-03-30 LAB — CBC WITH DIFFERENTIAL/PLATELET
Basophils Absolute: 0 K/uL (ref 0.0–0.1)
Basophils Relative: 0.8 % (ref 0.0–3.0)
Eosinophils Absolute: 0.1 K/uL (ref 0.0–0.7)
Eosinophils Relative: 2.1 % (ref 0.0–5.0)
HCT: 29 % — ABNORMAL LOW (ref 36.0–46.0)
Hemoglobin: 9.5 g/dL — ABNORMAL LOW (ref 12.0–15.0)
Lymphocytes Relative: 41 % (ref 12.0–46.0)
Lymphs Abs: 1.2 K/uL (ref 0.7–4.0)
MCHC: 32.6 g/dL (ref 30.0–36.0)
MCV: 81.9 fl (ref 78.0–100.0)
Monocytes Absolute: 0.3 K/uL (ref 0.1–1.0)
Monocytes Relative: 9 % (ref 3.0–12.0)
Neutro Abs: 1.4 K/uL (ref 1.4–7.7)
Neutrophils Relative %: 47.1 % (ref 43.0–77.0)
Platelets: 244 K/uL (ref 150.0–400.0)
RBC: 3.55 Mil/uL — ABNORMAL LOW (ref 3.87–5.11)
RDW: 15.3 % (ref 11.5–15.5)
WBC: 3 K/uL — ABNORMAL LOW (ref 4.0–10.5)

## 2024-03-30 LAB — COMPREHENSIVE METABOLIC PANEL WITH GFR
ALT: 8 U/L (ref 0–35)
AST: 12 U/L (ref 0–37)
Albumin: 4 g/dL (ref 3.5–5.2)
Alkaline Phosphatase: 60 U/L (ref 39–117)
BUN: 9 mg/dL (ref 6–23)
CO2: 28 meq/L (ref 19–32)
Calcium: 9 mg/dL (ref 8.4–10.5)
Chloride: 107 meq/L (ref 96–112)
Creatinine, Ser: 0.68 mg/dL (ref 0.40–1.20)
GFR: 113.87 mL/min (ref >60.00)
Glucose, Bld: 78 mg/dL (ref 70–99)
Potassium: 3.8 meq/L (ref 3.5–5.1)
Sodium: 139 meq/L (ref 135–145)
Total Bilirubin: 0.8 mg/dL (ref 0.2–1.2)
Total Protein: 6.5 g/dL (ref 6.0–8.3)

## 2024-03-30 NOTE — Telephone Encounter (Signed)
 Spoke with patient. She was seen by her family medicine physician yesterday after going to the ED for abdominal pain. According to note from [facility], she was to call our office and arrange a follow up visit for her fibroids. Informed patient that ultrasound could be discussed at her appointment with [facility]. Patient is already scheduled for ultrasound of abdomen on 04/01/2024. Patient verbalized understanding and is scheduled for appointment with [facility] for 04/06/2024 at 1:55 pm.   Morna LOISE Quale, RN

## 2024-03-30 NOTE — Telephone Encounter (Signed)
 Pt called and stated that she was told to call the office to get an ultrasound appointment. I told the pt that someone from the office would give her a call.

## 2024-04-01 ENCOUNTER — Ambulatory Visit
Admission: RE | Admit: 2024-04-01 | Discharge: 2024-04-01 | Disposition: A | Discharge status: Home / Self Care | Source: Ambulatory Visit | Attending: Emergency Medicine | Admitting: Emergency Medicine

## 2024-04-01 DIAGNOSIS — R1010 Upper abdominal pain, unspecified: Secondary | ICD-10-CM

## 2024-04-01 DIAGNOSIS — R161 Splenomegaly, not elsewhere classified: Secondary | ICD-10-CM

## 2024-04-05 ENCOUNTER — Ambulatory Visit: Payer: Self-pay | Admitting: Emergency Medicine

## 2024-04-06 ENCOUNTER — Ambulatory Visit (HOSPITAL_BASED_OUTPATIENT_CLINIC_OR_DEPARTMENT_OTHER): Admitting: Certified Nurse Midwife

## 2024-04-09 ENCOUNTER — Ambulatory Visit: Admitting: Family Medicine

## 2024-04-09 ENCOUNTER — Ambulatory Visit (HOSPITAL_BASED_OUTPATIENT_CLINIC_OR_DEPARTMENT_OTHER): Admitting: Certified Nurse Midwife

## 2024-04-13 ENCOUNTER — Encounter (HOSPITAL_BASED_OUTPATIENT_CLINIC_OR_DEPARTMENT_OTHER): Payer: Self-pay | Admitting: Certified Nurse Midwife

## 2024-04-13 ENCOUNTER — Ambulatory Visit (HOSPITAL_BASED_OUTPATIENT_CLINIC_OR_DEPARTMENT_OTHER): Admitting: Certified Nurse Midwife

## 2024-04-13 VITALS — BP 115/78 | HR 92 | Ht 67.0 in | Wt 192.2 lb

## 2024-04-13 DIAGNOSIS — R32 Unspecified urinary incontinence: Secondary | ICD-10-CM | POA: Diagnosis not present

## 2024-04-13 DIAGNOSIS — D219 Benign neoplasm of connective and other soft tissue, unspecified: Secondary | ICD-10-CM

## 2024-04-13 DIAGNOSIS — N852 Hypertrophy of uterus: Secondary | ICD-10-CM | POA: Diagnosis not present

## 2024-04-13 LAB — POCT URINALYSIS DIP (CLINITEK)
Bilirubin, UA: NEGATIVE
Blood, UA: NEGATIVE
Glucose, UA: NEGATIVE mg/dL
Leukocytes, UA: NEGATIVE
Nitrite, UA: NEGATIVE
POC PROTEIN,UA: NEGATIVE
Spec Grav, UA: 1.025 (ref 1.010–1.025)
Urobilinogen, UA: 0.2 U/dL
pH, UA: 7 (ref 5.0–8.0)

## 2024-04-14 ENCOUNTER — Ambulatory Visit (INDEPENDENT_AMBULATORY_CARE_PROVIDER_SITE_OTHER)

## 2024-04-14 DIAGNOSIS — R9389 Abnormal findings on diagnostic imaging of other specified body structures: Secondary | ICD-10-CM

## 2024-04-14 DIAGNOSIS — D219 Benign neoplasm of connective and other soft tissue, unspecified: Secondary | ICD-10-CM | POA: Insufficient documentation

## 2024-04-14 DIAGNOSIS — Z8742 Personal history of other diseases of the female genital tract: Secondary | ICD-10-CM

## 2024-04-14 DIAGNOSIS — N852 Hypertrophy of uterus: Secondary | ICD-10-CM

## 2024-04-14 NOTE — Progress Notes (Incomplete Revision)
 GYNECOLOGY  VISIT  CC:   Follow-up from ED   HPI: 33 y.o. G78P2012 Married [name] here for follow-up from ED. Pt had experienced some severe left pelvic pain that has resolved. She still feels bloated. Pt states periods currently monthly and regular with mild menorrhagia at times. She does not desire birth control at this time. She and spouse may decide on another future pregnancy.  Pt presented to ED 03/26/24 with LLQ pain. Pain seems to have resolved. She does feel like her abdomen is enlarged. Patient also concerned that she is needing to urinate frequently and is having difficulty with urinary incontinence. Pt wearing a pad continuously because she states the urge to urinate may occur and she may not make it to bathroom in time before she voids. POCT UA normal today, Urine Culture sent.   CT of her abdomen while in ED 03/26/24 indicated:  The uterine fundus is mildly enlarged and heterogeneous in appearance. The bilateral adnexa are unremarkable.    Past Medical History:  Diagnosis Date   Dysmenorrhea    Medical history non-contributory    Pregnancy induced hypertension     MEDS:   Current Outpatient Medications on File Prior to Visit  Medication Sig Dispense Refill   cetirizine  (ZYRTEC ) 10 MG tablet Take 1 tablet (10 mg total) by mouth daily. 30 tablet 11   HYDROcodone-acetaminophen  (NORCO/VICODIN) 5-325 MG tablet Take 1 tablet by mouth every 4 (four) hours as needed. 12 tablet 0   naproxen  (NAPROSYN ) 500 MG tablet Take 1 tablet (500 mg total) by mouth 2 (two) times daily. 30 tablet 0   omeprazole (PRILOSEC) 20 MG capsule Take 1 capsule (20 mg total) by mouth daily. 30 capsule 0   ondansetron  (ZOFRAN -ODT) 4 MG disintegrating tablet Take 1 tablet (4 mg total) by mouth every 8 (eight) hours as needed for vomiting. 20 tablet 0   amoxicillin -clavulanate (AUGMENTIN ) 875-125 MG tablet Take 1 tablet by mouth 2 (two) times daily. (Patient not taking: Reported on  04/13/2024) 14 tablet 0   diphenhydramine -calamine (CALAMINE PLUS) 1-8 % LOTN Apply to affected area PRN (Patient not taking: Reported on 03/29/2024) 1 mL 0   fluticasone  (FLONASE ) 50 MCG/ACT nasal spray Place 2 sprays into both nostrils daily. (Patient not taking: Reported on 03/29/2024) 11.1 mL 5   hydrOXYzine  (ATARAX ) 25 MG tablet Take 1 tablet (25 mg total) by mouth 3 (three) times daily as needed. (Patient not taking: Reported on 03/29/2024) 30 tablet 0   linezolid  (ZYVOX ) 600 MG tablet Take 1 tablet (600 mg total) by mouth 2 (two) times daily. (Patient not taking: Reported on 03/29/2024) 14 tablet 0   tranexamic acid  (LYSTEDA ) 650 MG TABS tablet Take 2 tablets three times daily for up to 2 to 3 days during heavy bleeding (Patient not taking: Reported on 03/29/2024) 30 tablet 0   No current facility-administered medications on file prior to visit.    ALLERGIES: Coconut flavoring agent (non-screening), Fish allergy, and Pineapple  PHYSICAL EXAMINATION:    BP 115/78 (BP Location: Right Arm, Patient Position: Sitting, Cuff Size: Normal)   Pulse 92   Ht 5' 7 (1.702 m)   Wt 192 lb 3.2 oz (87.2 kg)   LMP 03/26/2024   SpO2 100%   BMI 30.10 kg/m     General appearance: alert, cooperative and appears stated age   Pelvic: External genitalia:  no lesions              Urethra:  normal appearing urethra  with no masses, tenderness or lesions              Bartholins and Skenes: normal                 Vagina: normal mucosa without prolapse or lesions              Cervix: multiparous appearance, nabothian cyst, no cervical motion tenderness, and no lesions              Bimanual Exam:  Uterus:  uterus is not tender and is enlarged              Adnexa: no mass, fullness, tenderness              Rectovaginal: Yes.  .  Confirms.              Anus:  normal sphincter tone, no lesions  Chaperone,  CMA, was present for exam.  Assessment/Plan:  1. Enlarged uterus (Primary) - US  PELVIC COMPLETE WITH  TRANSVAGINAL; Future, 04/14/24  2. Urinary incontinence, unspecified type - POCT URINALYSIS DIP (CLINITEK) - Urine Culture  Arland MARLA Roller

## 2024-04-14 NOTE — Progress Notes (Addendum)
 GYNECOLOGY  VISIT  CC:   Follow-up from ED   HPI: 33 y.o. G24P2012 Married [name] here for follow-up from ED. Pt had experienced some severe left pelvic pain that has resolved. She still feels bloated. Pt states periods currently monthly and regular with mild menorrhagia at times. She does not desire birth control at this time. She and spouse may decide on another future pregnancy.  Pt presented to ED 03/26/24 with LLQ pain. Pain seems to have resolved. She does feel like her abdomen is enlarged. Patient also concerned that she is needing to urinate frequently and is having difficulty with urinary incontinence. Pt wearing a pad continuously because she states the urge to urinate may occur and she may not make it to bathroom in time before she voids. POCT UA normal today, Urine Culture sent.   CT of her abdomen while in ED 03/26/24 indicated:  The uterine fundus is mildly enlarged and heterogeneous in appearance. The bilateral adnexa are unremarkable.    Past Medical History:  Diagnosis Date   Dysmenorrhea    Medical history non-contributory    Pregnancy induced hypertension     MEDS:   Current Outpatient Medications on File Prior to Visit  Medication Sig Dispense Refill   cetirizine  (ZYRTEC ) 10 MG tablet Take 1 tablet (10 mg total) by mouth daily. 30 tablet 11   HYDROcodone-acetaminophen  (NORCO/VICODIN) 5-325 MG tablet Take 1 tablet by mouth every 4 (four) hours as needed. 12 tablet 0   naproxen  (NAPROSYN ) 500 MG tablet Take 1 tablet (500 mg total) by mouth 2 (two) times daily. 30 tablet 0   omeprazole (PRILOSEC) 20 MG capsule Take 1 capsule (20 mg total) by mouth daily. 30 capsule 0   ondansetron  (ZOFRAN -ODT) 4 MG disintegrating tablet Take 1 tablet (4 mg total) by mouth every 8 (eight) hours as needed for vomiting. 20 tablet 0   amoxicillin -clavulanate (AUGMENTIN ) 875-125 MG tablet Take 1 tablet by mouth 2 (two) times daily. (Patient not taking: Reported on  04/13/2024) 14 tablet 0   diphenhydramine -calamine (CALAMINE PLUS) 1-8 % LOTN Apply to affected area PRN (Patient not taking: Reported on 03/29/2024) 1 mL 0   fluticasone  (FLONASE ) 50 MCG/ACT nasal spray Place 2 sprays into both nostrils daily. (Patient not taking: Reported on 03/29/2024) 11.1 mL 5   hydrOXYzine  (ATARAX ) 25 MG tablet Take 1 tablet (25 mg total) by mouth 3 (three) times daily as needed. (Patient not taking: Reported on 03/29/2024) 30 tablet 0   linezolid  (ZYVOX ) 600 MG tablet Take 1 tablet (600 mg total) by mouth 2 (two) times daily. (Patient not taking: Reported on 03/29/2024) 14 tablet 0   tranexamic acid  (LYSTEDA ) 650 MG TABS tablet Take 2 tablets three times daily for up to 2 to 3 days during heavy bleeding (Patient not taking: Reported on 03/29/2024) 30 tablet 0   No current facility-administered medications on file prior to visit.    ALLERGIES: Coconut flavoring agent (non-screening), Fish allergy, and Pineapple  PHYSICAL EXAMINATION:    BP 115/78 (BP Location: Right Arm, Patient Position: Sitting, Cuff Size: Normal)   Pulse 92   Ht 5' 7 (1.702 m)   Wt 192 lb 3.2 oz (87.2 kg)   LMP 03/26/2024   SpO2 100%   BMI 30.10 kg/m     General appearance: alert, cooperative and appears stated age   Pelvic: External genitalia:  no lesions              Urethra:  normal appearing urethra  with no masses, tenderness or lesions              Bartholins and Skenes: normal                 Vagina: normal mucosa without prolapse or lesions              Cervix: multiparous appearance, nabothian cyst, no cervical motion tenderness, and no lesions              Bimanual Exam:  Uterus:  uterus is not tender and is enlarged              Adnexa: no mass, fullness, tenderness              Rectovaginal: Yes.  .  Confirms.              Anus:  normal sphincter tone, no lesions  Chaperone,  CMA, was present for exam.  Assessment/Plan:  1. Enlarged uterus (Primary) - US  PELVIC COMPLETE WITH  TRANSVAGINAL; Future, 04/14/24  2. Urinary incontinence, unspecified type - POCT URINALYSIS DIP (CLINITEK) - Urine Culture  Diane [name]

## 2024-04-15 ENCOUNTER — Ambulatory Visit (HOSPITAL_BASED_OUTPATIENT_CLINIC_OR_DEPARTMENT_OTHER): Payer: Self-pay | Admitting: Certified Nurse Midwife

## 2024-04-15 DIAGNOSIS — N3941 Urge incontinence: Secondary | ICD-10-CM

## 2024-04-15 LAB — URINE CULTURE

## 2024-04-15 NOTE — Progress Notes (Signed)
 This pt has urinary urge incontinence. I put in a referral to Urology ([facility]). Please fax.  Diane [name]

## 2024-04-19 ENCOUNTER — Encounter: Payer: Self-pay | Admitting: Physician Assistant

## 2024-04-19 ENCOUNTER — Ambulatory Visit: Admitting: Physician Assistant

## 2024-04-19 VITALS — BP 122/80

## 2024-04-19 DIAGNOSIS — L810 Postinflammatory hyperpigmentation: Secondary | ICD-10-CM

## 2024-04-19 DIAGNOSIS — L700 Acne vulgaris: Secondary | ICD-10-CM | POA: Diagnosis not present

## 2024-04-19 MED ORDER — TRETINOIN 0.025 % EX CREA: TOPICAL_CREAM | CUTANEOUS | 2 refills | Status: AC

## 2024-04-19 MED ORDER — HYDROQUINONE 4 % EX CREA: TOPICAL_CREAM | CUTANEOUS | 0 refills | Status: DC

## 2024-04-19 MED ORDER — HYDROQUINONE 4 % EX CREA: TOPICAL_CREAM | CUTANEOUS | 1 refills | Status: AC

## 2024-04-19 NOTE — Progress Notes (Signed)
   New Patient Visit   Subjective  Diane [name] is a 33 y.o. female who presents for a NEW PATIENT appointment to be examined for the concerns as listed below.  Acne w/ PIH: Pt has had acne since a child. She has not tried any prescription medications. She has tried OTC [place] scrub for her face and vitamin c serum.    Are you nursing, pregnant or trying to conceive? No    No Hx of Bx.  No family Hx of skin cancer.   The following portions of the chart were reviewed this encounter and updated as appropriate: medications, allergies, medical history  Review of Systems:  No other skin or systemic complaints except as noted in HPI or Assessment and Plan.  Objective  Well appearing patient in no apparent distress; mood and affect are within normal limits.   A focused examination was performed of the following areas: Face, arms, and legs    Relevant exam findings are noted in the Assessment and Plan.          Assessment & Plan   ACNE VULGARIS w/ PIH --- FACE  Exam: Open comedones and inflammatory papules  flared  Treatment Plan: - Rx Tretinoin 0.025% - apply 1gm (pea size) amount to face every other night - Use Panoxyl BPO wash every morning and gentle cleanser at night   POST-INFLAMMATORY HYPERPIGMENTATION- legs and arms  - Likely related to insect bites as she mentioned  - Patient requests hydroquinone (that she has read about online) but I mentioned that it is not meant for large body surface areas and definitely not longer than 3 months. - Patient also inquiring about potential laser procedures to assist with the dark spots. I will place referral to [facility].    POST-INFLAMMATORY HYPERPIGMENTATION   Related Procedures Ambulatory referral to Plastic Surgery ACNE VULGARIS    Return in about 3 months (around 07/20/2024).   Documentation: I have reviewed the above documentation for accuracy and completeness, and I agree with the above.  I,  Shirron Maranda, CMA, am acting as Neurosurgeon for [employer], DO.   Mikiala Fugett K, PA-C

## 2024-04-19 NOTE — Patient Instructions (Addendum)
 - Rx Tretinoin 0.025% - apply 1gm (pea size) amount to face every other night - Use Panoxyl BPO wash every morning and gentle cleanser at night - Rx Hydroquinone - apply to dark marks area once daily at night. Do not exceed 3 months of use.   Important Information  Due to recent changes in healthcare laws, you may see results of your pathology and/or laboratory studies on MyChart before the doctors have had a chance to review them. We understand that in some cases there may be results that are confusing or concerning to you. Please understand that not all results are received at the same time and often the doctors may need to interpret multiple results in order to provide you with the best plan of care or course of treatment. Therefore, we ask that you please give us  2 business days to thoroughly review all your results before contacting the office for clarification. Should we see a critical lab result, you will be contacted sooner.   If You Need Anything After Your Visit  If you have any questions or concerns for your doctor, please call our main line at [phone] If no one answers, please leave a voicemail as directed and we will return your call as soon as possible. Messages left after 4 pm will be answered the following business day.   You may also send us  a message via MyChart. We typically respond to MyChart messages within 1-2 business days.  For prescription refills, please ask your pharmacy to contact our office. Our fax number is [phone].  If you have an urgent issue when the clinic is closed that cannot wait until the next business day, you can page your doctor at the number below.    Please note that while we do our best to be available for urgent issues outside of office hours, we are not available 24/7.   If you have an urgent issue and are unable to reach us , you may choose to seek medical care at your doctor's office, retail clinic, urgent care center, or emergency  room.  If you have a medical emergency, please immediately call 911 or go to the emergency department. In the event of inclement weather, please call our main line at [phone] for an update on the status of any delays or closures.  Dermatology Medication Tips: Please keep the boxes that topical medications come in in order to help keep track of the instructions about where and how to use these. Pharmacies typically print the medication instructions only on the boxes and not directly on the medication tubes.   If your medication is too expensive, please contact our office at [phone] or send us  a message through MyChart.   We are unable to tell what your co-pay for medications will be in advance as this is different depending on your insurance coverage. However, we may be able to find a substitute medication at lower cost or fill out paperwork to get insurance to cover a needed medication.   If a prior authorization is required to get your medication covered by your insurance company, please allow us  1-2 business days to complete this process.  Drug prices often vary depending on where the prescription is filled and some pharmacies may offer cheaper prices.  The website www.goodrx.com contains coupons for medications through different pharmacies. The prices here do not account for what the cost may be with help from insurance (it may be cheaper with your insurance), but the website can give you  the price if you did not use any insurance.  - You can print the associated coupon and take it with your prescription to the pharmacy.  - You may also stop by our office during regular business hours and pick up a GoodRx coupon card.  - If you need your prescription sent electronically to a different pharmacy, notify our office through [facility] or by phone at [phone]

## 2024-04-27 ENCOUNTER — Encounter: Payer: Self-pay | Admitting: Family Medicine

## 2024-04-28 ENCOUNTER — Inpatient Hospital Stay: Attending: Hematology and Oncology | Admitting: Hematology and Oncology

## 2024-04-28 VITALS — BP 132/68 | HR 81 | Temp 97.3°F | Resp 16 | Wt 190.4 lb

## 2024-04-28 DIAGNOSIS — D500 Iron deficiency anemia secondary to blood loss (chronic): Secondary | ICD-10-CM | POA: Diagnosis not present

## 2024-04-28 DIAGNOSIS — D509 Iron deficiency anemia, unspecified: Secondary | ICD-10-CM | POA: Insufficient documentation

## 2024-04-28 MED ORDER — POLYETHYLENE GLYCOL 3350 17 G PO PACK: 17.0000 g | PACK | Freq: Every day | ORAL | 1 refills | Status: AC | PRN

## 2024-04-28 NOTE — Progress Notes (Signed)
 [facility] CONSULT NOTE  Patient Care Team: Thedora Garnette HERO, MD as PCP - General (Family Medicine) Tad, Arland POUR, CNM as Midwife (Certified Nurse Midwife) Loretha Ash, MD as Consulting Physician (Hematology and Oncology)  CHIEF COMPLAINTS/PURPOSE OF CONSULTATION:  [name]  ASSESSMENT & PLAN:   Assessment and Plan Assessment & Plan  Iron  deficiency anemia Iron  deficiency anemia with hemoglobin at 9.5. Symptoms include fatigue, cold intolerance, and brittle nails. Prefers IV iron  due to oral supplement intolerance. - Order IV iron  infusion. - Schedule follow-up in 3 months to reassess hemoglobin levels.  Constipation Intermittent constipation contributing to hemorrhoid symptoms. - Prescribe Miralax  as needed. - Advise increased water intake and consider stool softeners or prune juice.  Hemorrhoids Intermittent rectal bleeding and pain suggest internal and external hemorrhoids. Constipation may contribute. - Consult primary care physician for possible referral to a surgeon for hemorrhoid banding if symptoms persist.   HISTORY OF PRESENTING ILLNESS:  Diane [name] 33 y.o. female is here because of [name]  Discussed the use of AI scribe software for clinical note transcription with the patient, who gave verbal consent to proceed.  History of Present Illness    Discussed the use of AI scribe software for clinical note transcription with the patient, who gave verbal consent to proceed.  History of Present Illness   Diane [name] is a 33 year old female with iron  deficiency anemia who presents for evaluation of low hemoglobin levels.  She has a history of iron  deficiency anemia with current hemoglobin levels at 9.5 g/dL, which is lower than previous measurements in August but improved from February. She received iron  treatment in February, which temporarily alleviated her symptoms, but she now feels her symptoms have worsened again.  She experiences fatigue and  excessive sleepiness, often falling asleep during activities such as doing homework or sitting in her car. No heavy menstruation, and she is not currently pregnant or sexually active.  She previously experienced pica, specifically craving clay, which has resolved. Currently, she has brittle nails that break easily, which she attributes to her iron  deficiency.  Regarding gastrointestinal symptoms, she occasionally notices blood when wiping after bowel movements, which she attributes to possible hemorrhoids. She experiences constipation at times but does not currently take any medication for it. She drinks a lot of water.  All other systems were reviewed with the patient and are negative.  MEDICAL HISTORY:  Past Medical History:  Diagnosis Date   Dysmenorrhea    Medical history non-contributory    Pregnancy induced hypertension     SURGICAL HISTORY: Past Surgical History:  Procedure Laterality Date   UMBILICAL HERNIA REPAIR  07/01/2009   umbilical     SOCIAL HISTORY: Social History   Socioeconomic History   Marital status: Married    Spouse name: Not on file   Number of children: 2   Years of education: 15   Highest education level: Some college, no degree  Occupational History   Occupation: Scientist, Product/process Development  Tobacco Use   Smoking status: Never   Smokeless tobacco: Never  Vaping Use   Vaping status: Never Used  Substance and Sexual Activity   Alcohol use: No    Alcohol/week: 0.0 standard drinks of alcohol   Drug use: No    Comment: Discussed birth control options   Sexual activity: Not Currently    Partners: Male    Birth control/protection: None  Other Topics Concern   Not on file  Social History Narrative   Not on file  Social Drivers of Corporate Investment Banker Strain: Not on file  Food Insecurity: Not on file  Transportation Needs: Not on file  Physical Activity: Not on file  Stress: Not on file  Social Connections: Not on file  Intimate Partner Violence: Not  on file    FAMILY HISTORY: Family History  Problem Relation Age of Onset   Hypertension Mother     ALLERGIES:  is allergic to coconut flavoring agent (non-screening), fish allergy, and pineapple.  MEDICATIONS:  Current Outpatient Medications  Medication Sig Dispense Refill   amoxicillin -clavulanate (AUGMENTIN ) 875-125 MG tablet Take 1 tablet by mouth 2 (two) times daily. 14 tablet 0   cetirizine  (ZYRTEC ) 10 MG tablet Take 1 tablet (10 mg total) by mouth daily. 30 tablet 11   diphenhydramine -calamine (CALAMINE PLUS) 1-8 % LOTN Apply to affected area PRN 1 mL 0   fluticasone  (FLONASE ) 50 MCG/ACT nasal spray Place 2 sprays into both nostrils daily. 11.1 mL 5   HYDROcodone -acetaminophen  (NORCO/VICODIN) 5-325 MG tablet Take 1 tablet by mouth every 4 (four) hours as needed. 12 tablet 0   hydroquinone  4 % cream Apply to dark marks area once daily at night. Do not exceed 3 months of use. 28.35 g 1   hydrOXYzine  (ATARAX ) 25 MG tablet Take 1 tablet (25 mg total) by mouth 3 (three) times daily as needed. 30 tablet 0   linezolid  (ZYVOX ) 600 MG tablet Take 1 tablet (600 mg total) by mouth 2 (two) times daily. 14 tablet 0   naproxen  (NAPROSYN ) 500 MG tablet Take 1 tablet (500 mg total) by mouth 2 (two) times daily. 30 tablet 0   omeprazole  (PRILOSEC) 20 MG capsule Take 1 capsule (20 mg total) by mouth daily. 30 capsule 0   ondansetron  (ZOFRAN -ODT) 4 MG disintegrating tablet Take 1 tablet (4 mg total) by mouth every 8 (eight) hours as needed for vomiting. 20 tablet 0   tranexamic acid  (LYSTEDA ) 650 MG TABS tablet Take 2 tablets three times daily for up to 2 to 3 days during heavy bleeding 30 tablet 0   tretinoin  (RETIN-A ) 0.025 % cream Apply 1gm (pea size) amount to face every other night 45 g 2   No current facility-administered medications for this visit.     PHYSICAL EXAMINATION: ECOG PERFORMANCE STATUS: 0 - Asymptomatic  Vitals:   04/28/24 1551  BP: 132/68  Pulse: 81  Resp: 16  Temp: (!)  97.3 F (36.3 C)  SpO2: 100%   Filed Weights   04/28/24 1551  Weight: 190 lb 6.4 oz (86.4 kg)    GENERAL:alert, no distress and comfortable SKIN: skin color, texture, turgor are normal, no rashes or significant lesions EYES: normal, conjunctiva are pink and non-injected, sclera clear OROPHARYNX:no exudate, no erythema and lips, buccal mucosa, and tongue normal  NECK: supple, thyroid normal size, non-tender, without nodularity LYMPH:  no palpable lymphadenopathy in the cervical, axillary  LUNGS: clear to auscultation and percussion with normal breathing effort HEART: regular rate & rhythm and no murmurs and no lower extremity edema ABDOMEN:abdomen soft, non-tender and normal bowel sounds Musculoskeletal:no cyanosis of digits and no clubbing  PSYCH: alert & oriented x 3 with fluent speech NEURO: no focal motor/sensory deficits  LABORATORY DATA:  I have reviewed the data as listed Lab Results  Component Value Date   WBC 3.0 (L) 03/29/2024   HGB 9.5 (L) 03/29/2024   HCT 29.0 (L) 03/29/2024   MCV 81.9 03/29/2024   PLT 244.0 03/29/2024     Chemistry  Component Value Date/Time   NA 139 03/29/2024 1634   K 3.8 03/29/2024 1634   CL 107 03/29/2024 1634   CO2 28 03/29/2024 1634   BUN 9 03/29/2024 1634   CREATININE 0.68 03/29/2024 1634   CREATININE 0.64 07/18/2023 1633      Component Value Date/Time   CALCIUM  9.0 03/29/2024 1634   ALKPHOS 60 03/29/2024 1634   AST 12 03/29/2024 1634   ALT 8 03/29/2024 1634   BILITOT 0.8 03/29/2024 1634       RADIOGRAPHIC STUDIES: I have personally reviewed the radiological images as listed and agreed with the findings in the report. US  PELVIC COMPLETE WITH TRANSVAGINAL Result Date: 04/16/2024 CLINICAL DATA:  possible fibroid, h/o adenomyosis on ultrasound  EXAM: TRANSVAGINAL ULTRASOUND OF PELVIS  TECHNIQUE: Transvaginal ultrasound examination of the pelvis was performed for imaging of the pelvis including uterus, ovaries, adnexal  regions, and pelvic cul-de-sac. COMPARISON: 10/10/2023   FINDINGS: Uterus: 8.7 x 7.5 x 3.1 cm.  No focal abnormalities.  Myometrial adenomyosis.  Volume: 211.7 ml  Endometrial thickness: 18.26mm, thickened and irregular.  No abnormal blood  Right ovary: 2.8 x 3.0 x 2.2cm.  Volume: 10.72ml  Left ovary: 2.3 x 1.8 x 3.2cm.  Volume: 7.91ml  Other findings:  No abnormal free fluid.  Cervix within normal limits.  Ultrasound with similar appearance to 10/10/2023 ultrasound.    US  Abdomen Complete Result Date: 04/01/2024 CLINICAL DATA:  abd pain, spleen enlargement seen on CT but would like to monitor for increased pain EXAM: ABDOMEN ULTRASOUND COMPLETE COMPARISON:  03/27/2024 FINDINGS: Gallbladder: Decompressed. No gallstones. No wall thickening or pericholecystic fluid. No sonographic Murphy's sign noted by sonographer. Common bile duct: Diameter: 2.4 mm Liver: Normal echogenicity. No focal lesion identified. No intrahepatic biliary ductal dilation. Portal vein is patent on color Doppler imaging with normal direction of blood flow towards the liver. IVC: No abnormality visualized. Pancreas: Visualized portion unremarkable. Spleen: Mild splenomegaly.  No mass. Right Kidney: Length: 10.5 cm. Normal echogenicity. No mass. No hydronephrosis or nephrolithiasis. Left Kidney: Length: 10.8 cm. Normal echogenicity. No mass. No hydronephrosis or nephrolithiasis. Abdominal aorta: No aneurysm visualized. Other findings: None. IMPRESSION: Redemonstrated mild splenomegaly. No splenic mass. Otherwise, no acute sonographic abnormality on this complete abdominal ultrasound. Electronically Signed   By: Rogelia Myers M.D.   On: 04/01/2024 13:23    All questions were answered. The patient knows to call the clinic with any problems, questions or concerns. I spent 30 minutes in the care of this patient including H and P, review of records, counseling and coordination of care.     Amber Stalls, MD 04/28/2024 3:56 PM

## 2024-04-30 ENCOUNTER — Encounter: Payer: Self-pay | Admitting: Hematology and Oncology

## 2024-05-03 ENCOUNTER — Other Ambulatory Visit (HOSPITAL_COMMUNITY): Payer: Self-pay | Admitting: Pharmacy Technician

## 2024-05-03 ENCOUNTER — Institutional Professional Consult (permissible substitution): Payer: Self-pay

## 2024-05-03 ENCOUNTER — Telehealth: Payer: Self-pay

## 2024-05-03 NOTE — Telephone Encounter (Addendum)
 Auth Submission: NO AUTH NEEDED Site of care: Site of care: [facility] Payer: BCBS/[employer] PPO / [place] MEDICA[employer] [place]  Medication & CPT/J Code(s) submitted: Venofer  (Iron  Sucrose) J1756 Diagnosis Code:  Route of submission (phone, fax, portal): portal Phone # Fax # Auth type: Buy/Bill HB Units/visits requested: 300mg  Reference number: [id number] Approval from: 05/03/24 to 06/30/24

## 2024-05-13 ENCOUNTER — Institutional Professional Consult (permissible substitution): Payer: Self-pay

## 2024-05-13 ENCOUNTER — Ambulatory Visit: Admitting: *Deleted

## 2024-05-13 VITALS — BP 102/63 | HR 67 | Temp 98.7°F | Resp 18 | Ht 67.0 in | Wt 193.2 lb

## 2024-05-13 DIAGNOSIS — N921 Excessive and frequent menstruation with irregular cycle: Secondary | ICD-10-CM | POA: Diagnosis not present

## 2024-05-13 DIAGNOSIS — D500 Iron deficiency anemia secondary to blood loss (chronic): Secondary | ICD-10-CM

## 2024-05-13 MED ORDER — IRON SUCROSE 300 MG IVPB - SIMPLE MED
300.0000 mg | Freq: Once | Status: AC
  Administered 2024-05-13: 300 mg via INTRAVENOUS
  Filled 2024-05-13: qty 265

## 2024-05-13 NOTE — Progress Notes (Signed)
 Diagnosis: Iron  Deficiency Anemia  Provider:  Mannam, Praveen MD  Procedure: IV Infusion  IV Type: Peripheral, IV Location: L Antecubital  Venofer  (Iron  Sucrose), Dose: 300 mg  Infusion Start Time: 1133 am  Infusion Stop Time: 1330 pm  Post Infusion IV Care: Observation period completed and Peripheral IV Discontinued  Discharge: Condition: Good, Destination: Home . AVS Provided  Performed by:  Trudy Lamarr LABOR, RN   At the end of the observation period, VSS. Patient stated to LPN that she had a mild headache and her vision was slightly blurry. This RN spoke with patient. Patient stated she had slept for the whole infusion and that her eyes might be blurry after resting. Patient stated no change in symptoms since she spoke with LPN. RN retook vitals, which remained stable. RN reiterated to patient concerns for patient's safety and well-being. Asked patient if vision had improved; patient stated she was ok. Educated patient to seek care in Emergency Department and/or to call her ordering provider for any new or worsening symptoms. Patient verbalized understanding.   Diane FORBES Sar, RN

## 2024-05-13 NOTE — Patient Instructions (Signed)

## 2024-05-20 ENCOUNTER — Ambulatory Visit (INDEPENDENT_AMBULATORY_CARE_PROVIDER_SITE_OTHER)

## 2024-05-20 VITALS — BP 122/68 | HR 73 | Temp 98.1°F | Resp 18 | Ht 67.0 in | Wt 187.2 lb

## 2024-05-20 DIAGNOSIS — D500 Iron deficiency anemia secondary to blood loss (chronic): Secondary | ICD-10-CM | POA: Diagnosis not present

## 2024-05-20 DIAGNOSIS — N921 Excessive and frequent menstruation with irregular cycle: Secondary | ICD-10-CM

## 2024-05-20 MED ORDER — IRON SUCROSE 300 MG IVPB - SIMPLE MED
300.0000 mg | Freq: Once | Status: AC
  Administered 2024-05-20: 300 mg via INTRAVENOUS
  Filled 2024-05-20: qty 265

## 2024-05-20 NOTE — Progress Notes (Signed)
 Diagnosis: Iron  Deficiency Anemia  Provider:  Praveen Mannam MD  Procedure: IV Infusion  IV Type: Peripheral, IV Location: L Forearm  Venofer  (Iron  Sucrose), Dose: 300 mg  Infusion Start Time: 1427  Infusion Stop Time: 1601  Post Infusion IV Care: Patient declined observation and Peripheral IV Discontinued  Discharge: Condition: Good, Destination: Home . AVS Declined  Performed by:  Keaisha Sublette, RN

## 2024-05-26 ENCOUNTER — Ambulatory Visit

## 2024-05-26 ENCOUNTER — Ambulatory Visit (INDEPENDENT_AMBULATORY_CARE_PROVIDER_SITE_OTHER)

## 2024-05-26 VITALS — BP 119/76 | HR 73 | Temp 98.6°F | Resp 16 | Ht 67.0 in | Wt 186.0 lb

## 2024-05-26 DIAGNOSIS — D500 Iron deficiency anemia secondary to blood loss (chronic): Secondary | ICD-10-CM

## 2024-05-26 DIAGNOSIS — N921 Excessive and frequent menstruation with irregular cycle: Secondary | ICD-10-CM

## 2024-05-26 MED ORDER — IRON SUCROSE 300 MG IVPB - SIMPLE MED
300.0000 mg | Freq: Once | Status: AC
  Administered 2024-05-26: 300 mg via INTRAVENOUS

## 2024-05-26 NOTE — Progress Notes (Signed)
 Diagnosis: , Iron  Deficiency Anemia  Provider:  Lonna Coder MD  Procedure: IV Infusion  IV Type: Peripheral, IV Location: L Antecubital  , Venofer  (Iron  Sucrose), Dose: 300 mg  Infusion Start Time: 1127  Infusion Stop Time: 1309  Post Infusion IV Care: Patient declined observation and Peripheral IV Discontinued  Discharge: Condition: Good, Destination: Home . AVS Declined  Performed by:  Siyon Linck, RN

## 2024-06-02 ENCOUNTER — Institutional Professional Consult (permissible substitution)

## 2024-06-18 ENCOUNTER — Inpatient Hospital Stay: Admission: RE | Admit: 2024-06-18 | Discharge: 2024-06-18 | Attending: Family Medicine

## 2024-06-18 ENCOUNTER — Encounter: Payer: Self-pay | Admitting: Family Medicine

## 2024-06-18 DIAGNOSIS — N644 Mastodynia: Secondary | ICD-10-CM

## 2024-06-18 DIAGNOSIS — D241 Benign neoplasm of right breast: Secondary | ICD-10-CM | POA: Insufficient documentation

## 2024-07-05 ENCOUNTER — Ambulatory Visit (INDEPENDENT_AMBULATORY_CARE_PROVIDER_SITE_OTHER): Admitting: Physician Assistant

## 2024-07-05 ENCOUNTER — Ambulatory Visit: Admitting: Physician Assistant

## 2024-07-05 ENCOUNTER — Encounter: Payer: Self-pay | Admitting: Physician Assistant

## 2024-07-05 DIAGNOSIS — L819 Disorder of pigmentation, unspecified: Secondary | ICD-10-CM

## 2024-07-05 DIAGNOSIS — L700 Acne vulgaris: Secondary | ICD-10-CM

## 2024-07-05 DIAGNOSIS — L810 Postinflammatory hyperpigmentation: Secondary | ICD-10-CM

## 2024-07-05 NOTE — Progress Notes (Signed)
" ° °  Follow-Up Visit   Subjective  Yan Okray is a 34 y.o. female ESTABLISHED PATIENT who presents for the following: Acne follow up - She is using tretinoin  0.025% cream with after every shower. She is washing with Panoxyl wash.  She is also following up for hyperpigmentation of legs. She is spot treating with hydroquinone . She has an appointment with [facility] this Friday to discuss other treatments.   The following portions of the chart were reviewed this encounter and updated as appropriate: medications, allergies, medical history  Review of Systems:  No other skin or systemic complaints except as noted in HPI or Assessment and Plan.  Objective  Well appearing patient in no apparent distress; mood and affect are within normal limits.   A focused examination was performed of the following areas: face, legs   Relevant exam findings are noted in the Assessment and Plan.    Assessment & Plan   ACNE VULGARIS Exam: resolving papules and post inflammatory hyperpigmentation   Improved  Treatment Plan: Continue Tretinoin  0.025% cream nightly  Continue washing with Panoxyl wash.   HYPERPIGMENTATION Exam: Hyperpigmented macules of legs - unchanged   Treatment Plan: Advised patient to discontinue hydroquinone  for a month.  Advised her to keep her appointment with Dr Montorfano.    Return in about 3 months (around 10/03/2024) for Follow up.  I, Roseline Hutchinson, CMA, am acting as scribe for Arieon Corcoran K, PA-C .   Documentation: I have reviewed the above documentation for accuracy and completeness, and I agree with the above.  Tripton Ned K, PA-C    "

## 2024-07-05 NOTE — Patient Instructions (Signed)

## 2024-07-08 ENCOUNTER — Telehealth: Payer: Self-pay | Admitting: Hematology and Oncology

## 2024-07-08 NOTE — Telephone Encounter (Signed)
 I spoke with patient and she is aware of rescheduled lab and MD appointments on 08/06/2024.

## 2024-07-09 ENCOUNTER — Ambulatory Visit (INDEPENDENT_AMBULATORY_CARE_PROVIDER_SITE_OTHER): Payer: Self-pay

## 2024-07-09 VITALS — BP 124/80 | HR 89 | Ht 67.0 in | Wt 184.4 lb

## 2024-07-09 DIAGNOSIS — L578 Other skin changes due to chronic exposure to nonionizing radiation: Secondary | ICD-10-CM

## 2024-07-09 MED ORDER — TRETINOIN 0.025 % EX CREA: TOPICAL_CREAM | Freq: Every day | CUTANEOUS | 0 refills | Status: AC

## 2024-07-09 NOTE — Progress Notes (Signed)
 "  New Patient Office Visit  Subjective    Patient ID: Diane [name], female    DOB: Apr 13, 1991  Age: 34 y.o. MRN: [id number]  CC:  Chief Complaint  Patient presents with   Consult         HPI Diane [name] presents to establish care Patient is a pleasant 34 year old female who presents for cosmetic recommendations regarding sun damage spots on her lower extremities as well as arms and face.  Patient is interested in potential laser treatments for this.  Outpatient Encounter Medications as of 07/09/2024  Medication Sig   cetirizine  (ZYRTEC ) 10 MG tablet Take 1 tablet (10 mg total) by mouth daily.   diphenhydramine -calamine (CALAMINE PLUS) 1-8 % LOTN Apply to affected area PRN   fluticasone  (FLONASE ) 50 MCG/ACT nasal spray Place 2 sprays into both nostrils daily.   hydroquinone  4 % cream Apply to dark marks area once daily at night. Do not exceed 3 months of use.   hydrOXYzine  (ATARAX ) 25 MG tablet Take 1 tablet (25 mg total) by mouth 3 (three) times daily as needed.   linezolid  (ZYVOX ) 600 MG tablet Take 1 tablet (600 mg total) by mouth 2 (two) times daily.   naproxen  (NAPROSYN ) 500 MG tablet Take 1 tablet (500 mg total) by mouth 2 (two) times daily.   tretinoin  (RETIN-A ) 0.025 % cream Apply 1gm (pea size) amount to face every other night   amoxicillin -clavulanate (AUGMENTIN ) 875-125 MG tablet Take 1 tablet by mouth 2 (two) times daily. (Patient not taking: Reported on 07/09/2024)   HYDROcodone -acetaminophen  (NORCO/VICODIN) 5-325 MG tablet Take 1 tablet by mouth every 4 (four) hours as needed. (Patient not taking: Reported on 07/09/2024)   omeprazole  (PRILOSEC) 20 MG capsule Take 1 capsule (20 mg total) by mouth daily. (Patient not taking: Reported on 07/09/2024)   ondansetron  (ZOFRAN -ODT) 4 MG disintegrating tablet Take 1 tablet (4 mg total) by mouth every 8 (eight) hours as needed for vomiting. (Patient not taking: Reported on 07/09/2024)   polyethylene glycol (MIRALAX ) 17 g packet Take  17 g by mouth daily as needed. (Patient not taking: Reported on 07/09/2024)   tranexamic acid  (LYSTEDA ) 650 MG TABS tablet Take 2 tablets three times daily for up to 2 to 3 days during heavy bleeding (Patient not taking: Reported on 07/09/2024)   No facility-administered encounter medications on file as of 07/09/2024.    Past Medical History:  Diagnosis Date   Dysmenorrhea    Medical history non-contributory    Pregnancy induced hypertension     Past Surgical History:  Procedure Laterality Date   UMBILICAL HERNIA REPAIR  07/01/2009   umbilical     Family History  Problem Relation Age of Onset   Hypertension Mother     Social History   Socioeconomic History   Marital status: Married    Spouse name: Not on file   Number of children: 2   Years of education: 15   Highest education level: Some college, no degree  Occupational History   Occupation: Scientist, Product/process Development  Tobacco Use   Smoking status: Never   Smokeless tobacco: Never  Vaping Use   Vaping status: Never Used  Substance and Sexual Activity   Alcohol use: No    Alcohol/week: 0.0 standard drinks of alcohol   Drug use: No    Comment: Discussed birth control options   Sexual activity: Not Currently    Partners: Male    Birth control/protection: None  Other Topics Concern   Not on file  Social History Narrative  Not on file   Social Drivers of Health   Tobacco Use: Low Risk (07/09/2024)   Patient History    Smoking Tobacco Use: Never    Smokeless Tobacco Use: Never    Passive Exposure: Not on file  Financial Resource Strain: Not on file  Food Insecurity: Not on file  Transportation Needs: Not on file  Physical Activity: Not on file  Stress: Not on file  Social Connections: Not on file  Intimate Partner Violence: Not on file  Depression (PHQ2-9): Low Risk (12/30/2023)   Depression (PHQ2-9)    PHQ-2 Score: 0  Alcohol Screen: Not on file  Housing: Not on file  Utilities: Not on file  Health Literacy: Not on file     ROS ROS negative except as noted in HPI   Objective    BP 124/80 (BP Location: Right Arm, Patient Position: Sitting, Cuff Size: Normal)   Pulse 89   Ht 5' 7 (1.702 m)   Wt 184 lb 6.4 oz (83.6 kg)   SpO2 99%   BMI 28.88 kg/m   Physical Exam MA as chaperone Patient with Kathren 6 skin type.  Presents several spots of hypochromia and hypochromia of the lower extremities as well as upper extremities and face.  Skin quality is good, minimal wrinkles.  Assessment & Plan:   Problem List Items Addressed This Visit   None Visit Diagnoses       Sun-damaged skin    -  Primary       We have discussed different treatment options for sun-damaged skin.  Risks and benefits of retinol cream and Moxi laser were explained to the patient.  The patient was allowed to ask questions.  All questions answered to patient satisfaction. We have decided that we will start with retinol cream on the face and arms, and we will do a trial run of Moxi laser on the lower extremities.  If we see signs of improvement we will perform a full treatment of the lower extremities and upper extremities.  Patient will follow-up with me in a week or two for a trial run of Moxi laser.  Patient is also using hydroquinone  cream.  Instructed to continue the therapy for now.  Jara Feider M Shanece Cochrane, MD   "

## 2024-07-30 ENCOUNTER — Inpatient Hospital Stay: Admitting: Hematology and Oncology

## 2024-07-30 ENCOUNTER — Inpatient Hospital Stay

## 2024-08-02 ENCOUNTER — Ambulatory Visit (HOSPITAL_BASED_OUTPATIENT_CLINIC_OR_DEPARTMENT_OTHER): Admitting: Certified Nurse Midwife

## 2024-08-05 ENCOUNTER — Other Ambulatory Visit: Payer: Self-pay

## 2024-08-05 DIAGNOSIS — D500 Iron deficiency anemia secondary to blood loss (chronic): Secondary | ICD-10-CM

## 2024-08-06 ENCOUNTER — Inpatient Hospital Stay: Attending: Hematology and Oncology

## 2024-08-06 ENCOUNTER — Inpatient Hospital Stay: Admitting: Hematology and Oncology

## 2024-08-06 VITALS — BP 122/64 | HR 92 | Temp 97.9°F | Resp 17 | Ht 67.0 in | Wt 185.3 lb

## 2024-08-06 DIAGNOSIS — D500 Iron deficiency anemia secondary to blood loss (chronic): Secondary | ICD-10-CM

## 2024-08-06 LAB — IRON AND IRON BINDING CAPACITY (CC-WL,HP ONLY)
Iron: 84 ug/dL (ref 28–170)
Saturation Ratios: 20 % (ref 10.4–31.8)
TIBC: 413 ug/dL (ref 250–450)
UIBC: 329 ug/dL

## 2024-08-06 LAB — CBC WITH DIFFERENTIAL (CANCER CENTER ONLY)
Abs Immature Granulocytes: 0.01 10*3/uL (ref 0.00–0.07)
Basophils Absolute: 0 10*3/uL (ref 0.0–0.1)
Basophils Relative: 1 %
Eosinophils Absolute: 0.1 10*3/uL (ref 0.0–0.5)
Eosinophils Relative: 2 %
HCT: 37.3 % (ref 36.0–46.0)
Hemoglobin: 12.8 g/dL (ref 12.0–15.0)
Immature Granulocytes: 0 %
Lymphocytes Relative: 39 %
Lymphs Abs: 1 10*3/uL (ref 0.7–4.0)
MCH: 29.9 pg (ref 26.0–34.0)
MCHC: 34.3 g/dL (ref 30.0–36.0)
MCV: 87.1 fL (ref 80.0–100.0)
Monocytes Absolute: 0.2 10*3/uL (ref 0.1–1.0)
Monocytes Relative: 9 %
Neutro Abs: 1.3 10*3/uL — ABNORMAL LOW (ref 1.7–7.7)
Neutrophils Relative %: 49 %
Platelet Count: 227 10*3/uL (ref 150–400)
RBC: 4.28 MIL/uL (ref 3.87–5.11)
RDW: 14.2 % (ref 11.5–15.5)
WBC Count: 2.6 10*3/uL — ABNORMAL LOW (ref 4.0–10.5)
nRBC: 0 % (ref 0.0–0.2)

## 2024-08-06 LAB — FERRITIN: Ferritin: 33 ng/mL (ref 11–307)

## 2024-08-06 NOTE — Progress Notes (Signed)
 [facility] CONSULT NOTE  Patient Care Team: Thedora Garnette HERO, MD as PCP - General (Family Medicine) Tad, Arland POUR, CNM as Midwife (Certified Nurse Midwife) Loretha Ash, MD as Consulting Physician (Hematology and Oncology)  CHIEF COMPLAINTS/PURPOSE OF CONSULTATION:  [name]  ASSESSMENT & PLAN:  Assessment & Plan  Iron  deficiency anemia Anemia improved with hemoglobin at 12.8 g/dL. Persistent cold intolerance and mild fatigue likely multifactorial. Ongoing abnormal uterine bleeding. - Provided reassurance regarding improved hemoglobin and anemia status. - Recommended sleep hygiene measures: meditation, sleep apps, melatonin, warm milk. - Advised continued gynecology follow-up for abnormal uterine bleeding. - Scheduled hematology follow-up in six months or sooner if symptoms worsen.   HISTORY OF PRESENTING ILLNESS:  Diane [name] 34 y.o. female is here because of [name]  Discussed the use of AI scribe software for clinical note transcription with the patient, who gave verbal consent to proceed.  History of Present Illness    Discussed the use of AI scribe software for clinical note transcription with the patient, who gave verbal consent to proceed.  History of Present Illness    Diane [name] is a 34 year old female with iron  deficiency anemia secondary to abnormal uterine bleeding who presents for hematology follow-up to assess persistent fatigue.  Her iron  deficiency anemia previously manifested with a hemoglobin of 9.5 g/dL, which improved to 87.1 g/dL following intravenous iron  therapy. Despite hematologic improvement, she continues to experience persistent cold intolerance, describing herself as always cold even indoors. This symptom is longstanding and unchanged.  Fatigue persists but is improved compared to prior visits. She describes her current tiredness as less severe, attributing some ongoing fatigue to poor sleep. Sleep quality has worsened over the  past week following travel for a funeral, with difficulty initiating sleep due to an active mind. She does not regularly use sleep aids, occasionally meditates, and is considering sleep apps to improve sleep hygiene.  She continues to experience heavy abnormal uterine bleeding and has discussed gynecologic interventions, including endometrial ablation and hysterectomy, with her gynecologist. She expresses a desire for future fertility and is hesitant to pursue definitive surgical management.  Apr 28, 2024: Evaluation for iron  deficiency anemia; hemoglobin measured at 9.5 g/dL with associated symptoms of fatigue, cold intolerance, and brittle nails. IV iron  infusion ordered due to intolerance of oral supplements, with follow-up planned in 3 months. Intermittent constipation and hemorrhoid symptoms addressed, Miralax  prescribed, and increased water intake advised; recommendation for primary care physician consultation for hemorrhoids if symptoms persist. Recent imaging reviewed, showing myometrial adenomyosis and mild splenomegaly.  All other systems were reviewed with the patient and are negative.  MEDICAL HISTORY:  Past Medical History:  Diagnosis Date   Dysmenorrhea    Medical history non-contributory    Pregnancy induced hypertension     SURGICAL HISTORY: Past Surgical History:  Procedure Laterality Date   UMBILICAL HERNIA REPAIR  07/01/2009   umbilical     SOCIAL HISTORY: Social History   Socioeconomic History   Marital status: Married    Spouse name: Not on file   Number of children: 2   Years of education: 15   Highest education level: Some college, no degree  Occupational History   Occupation: Scientist, Product/process Development  Tobacco Use   Smoking status: Never   Smokeless tobacco: Never  Vaping Use   Vaping status: Never Used  Substance and Sexual Activity   Alcohol use: No    Alcohol/week: 0.0 standard drinks of alcohol   Drug use: No    Comment: Discussed  birth control options   Sexual  activity: Not Currently    Partners: Male    Birth control/protection: None  Other Topics Concern   Not on file  Social History Narrative   Not on file   Social Drivers of Health   Tobacco Use: Low Risk (07/09/2024)   Patient History    Smoking Tobacco Use: Never    Smokeless Tobacco Use: Never    Passive Exposure: Not on file  Financial Resource Strain: Not on file  Food Insecurity: Not on file  Transportation Needs: Not on file  Physical Activity: Not on file  Stress: Not on file  Social Connections: Not on file  Intimate Partner Violence: Not on file  Depression (PHQ2-9): Low Risk (12/30/2023)   Depression (PHQ2-9)    PHQ-2 Score: 0  Alcohol Screen: Not on file  Housing: Not on file  Utilities: Not on file  Health Literacy: Not on file    FAMILY HISTORY: Family History  Problem Relation Age of Onset   Hypertension Mother     ALLERGIES:  is allergic to coconut flavoring agent (non-screening), fish allergy, and pineapple.  MEDICATIONS:  Current Outpatient Medications  Medication Sig Dispense Refill   amoxicillin -clavulanate (AUGMENTIN ) 875-125 MG tablet Take 1 tablet by mouth 2 (two) times daily. (Patient not taking: Reported on 07/09/2024) 14 tablet 0   cetirizine  (ZYRTEC ) 10 MG tablet Take 1 tablet (10 mg total) by mouth daily. 30 tablet 11   diphenhydramine -calamine (CALAMINE PLUS) 1-8 % LOTN Apply to affected area PRN 1 mL 0   fluticasone  (FLONASE ) 50 MCG/ACT nasal spray Place 2 sprays into both nostrils daily. 11.1 mL 5   HYDROcodone -acetaminophen  (NORCO/VICODIN) 5-325 MG tablet Take 1 tablet by mouth every 4 (four) hours as needed. (Patient not taking: Reported on 07/09/2024) 12 tablet 0   hydroquinone  4 % cream Apply to dark marks area once daily at night. Do not exceed 3 months of use. 28.35 g 1   hydrOXYzine  (ATARAX ) 25 MG tablet Take 1 tablet (25 mg total) by mouth 3 (three) times daily as needed. 30 tablet 0   linezolid  (ZYVOX ) 600 MG tablet Take 1 tablet (600 mg  total) by mouth 2 (two) times daily. 14 tablet 0   naproxen  (NAPROSYN ) 500 MG tablet Take 1 tablet (500 mg total) by mouth 2 (two) times daily. 30 tablet 0   omeprazole  (PRILOSEC) 20 MG capsule Take 1 capsule (20 mg total) by mouth daily. (Patient not taking: Reported on 07/09/2024) 30 capsule 0   ondansetron  (ZOFRAN -ODT) 4 MG disintegrating tablet Take 1 tablet (4 mg total) by mouth every 8 (eight) hours as needed for vomiting. (Patient not taking: Reported on 07/09/2024) 20 tablet 0   polyethylene glycol (MIRALAX ) 17 g packet Take 17 g by mouth daily as needed. (Patient not taking: Reported on 07/09/2024) 14 each 1   tranexamic acid  (LYSTEDA ) 650 MG TABS tablet Take 2 tablets three times daily for up to 2 to 3 days during heavy bleeding (Patient not taking: Reported on 07/09/2024) 30 tablet 0   tretinoin  (RETIN-A ) 0.025 % cream Apply 1gm (pea size) amount to face every other night 45 g 2   tretinoin  (RETIN-A ) 0.025 % cream Apply topically at bedtime. 45 g 0   No current facility-administered medications for this visit.     PHYSICAL EXAMINATION: ECOG PERFORMANCE STATUS: 0 - Asymptomatic  There were no vitals filed for this visit.  There were no vitals filed for this visit.   GENERAL:alert, no distress and comfortable  SKIN: skin color, texture, turgor are normal, no rashes or significant lesions EYES: normal, conjunctiva are pink and non-injected, sclera clear OROPHARYNX:no exudate, no erythema and lips, buccal mucosa, and tongue normal  NECK: supple, thyroid normal size, non-tender, without nodularity LYMPH:  no palpable lymphadenopathy in the cervical, axillary  LUNGS: clear to auscultation and percussion with normal breathing effort HEART: regular rate & rhythm and no murmurs and no lower extremity edema ABDOMEN:abdomen soft, non-tender and normal bowel sounds Musculoskeletal:no cyanosis of digits and no clubbing  PSYCH: alert & oriented x 3 with fluent speech NEURO: no focal motor/sensory  deficits  LABORATORY DATA:  I have reviewed the data as listed Lab Results  Component Value Date   WBC 2.6 (L) 08/06/2024   HGB 12.8 08/06/2024   HCT 37.3 08/06/2024   MCV 87.1 08/06/2024   PLT 227 08/06/2024     Chemistry      Component Value Date/Time   NA 139 03/29/2024 1634   K 3.8 03/29/2024 1634   CL 107 03/29/2024 1634   CO2 28 03/29/2024 1634   BUN 9 03/29/2024 1634   CREATININE 0.68 03/29/2024 1634   CREATININE 0.64 07/18/2023 1633      Component Value Date/Time   CALCIUM  9.0 03/29/2024 1634   ALKPHOS 60 03/29/2024 1634   AST 12 03/29/2024 1634   ALT 8 03/29/2024 1634   BILITOT 0.8 03/29/2024 1634       RADIOGRAPHIC STUDIES: I have personally reviewed the radiological images as listed and agreed with the findings in the report. No results found.   All questions were answered. The patient knows to call the clinic with any problems, questions or concerns. I spent 20 minutes in the care of this patient including H and P, review of records, counseling and coordination of care.     Amber Stalls, MD 08/06/2024 3:11 PM

## 2024-08-10 ENCOUNTER — Ambulatory Visit (HOSPITAL_BASED_OUTPATIENT_CLINIC_OR_DEPARTMENT_OTHER): Admitting: Obstetrics and Gynecology

## 2024-08-11 ENCOUNTER — Other Ambulatory Visit: Payer: Self-pay

## 2024-08-27 ENCOUNTER — Ambulatory Visit: Admitting: Hematology and Oncology

## 2024-08-27 ENCOUNTER — Other Ambulatory Visit

## 2024-10-19 ENCOUNTER — Ambulatory Visit: Admitting: Physician Assistant

## 2025-02-04 ENCOUNTER — Inpatient Hospital Stay: Admitting: Hematology and Oncology

## 2025-02-04 ENCOUNTER — Inpatient Hospital Stay: Attending: Hematology and Oncology
